# Patient Record
Sex: Male | Born: 2000 | Race: White | Hispanic: No | Marital: Single | Smoking: Never smoker
Health system: Southern US, Community
[De-identification: ages and names within clinical notes are randomized; demographics above are authoritative.]

## PROBLEM LIST (undated history)

## (undated) DIAGNOSIS — N200 Calculus of kidney: Secondary | ICD-10-CM

## (undated) DIAGNOSIS — E66813 Obesity, class 3: Secondary | ICD-10-CM

## (undated) DIAGNOSIS — J309 Allergic rhinitis, unspecified: Secondary | ICD-10-CM

## (undated) DIAGNOSIS — E669 Obesity, unspecified: Secondary | ICD-10-CM

## (undated) HISTORY — DX: Morbid (severe) obesity due to excess calories: E66.01

## (undated) HISTORY — DX: Obesity, class 3: E66.813

## (undated) HISTORY — DX: Allergic rhinitis, unspecified: J30.9

## (undated) HISTORY — DX: Obesity, unspecified: E66.9

## (undated) HISTORY — PX: TONSILLECTOMY: SUR1361

---
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
## 2020-02-21 ENCOUNTER — Ambulatory Visit (INDEPENDENT_AMBULATORY_CARE_PROVIDER_SITE_OTHER): Payer: Medicaid Other | Admitting: Licensed Clinical Social Worker

## 2020-02-21 ENCOUNTER — Other Ambulatory Visit: Payer: Self-pay

## 2020-02-21 DIAGNOSIS — Z0001 Encounter for general adult medical examination with abnormal findings: Secondary | ICD-10-CM

## 2020-02-21 DIAGNOSIS — F902 Attention-deficit hyperactivity disorder, combined type: Secondary | ICD-10-CM

## 2020-02-21 NOTE — BH Specialist Note (Signed)
Integrated Behavioral Health Initial Visit  MRN: [id number] Name: Nathan Bird  Number of Integrated Behavioral Health Clinician visits:: 1/6 Session Start time: 2:15pm Session End time: 3:10pm Total time: 55   Type of Service: Integrated Behavioral Health- Individual Interpretor:No.   SUBJECTIVE: Nathan Bird is a 19 y.o. male who attended his appointment alone today. Patient was referred by his own request due to problems at work. Patient reports the following symptoms/concerns: Patient reports that he has been feeling very overwhelmed at times with work because he cannot communicate with others clearly, manage his time effectively and feels anxious about being an annoyance or burden to others.  Duration of problem: several years; Severity of problem: mild  OBJECTIVE: Mood: Anxious and Affect: Appropriate Risk of harm to self or others: No plan to harm self or others  LIFE CONTEXT: Family and Social: Patient lives at home with Nathan Bird, Nathan Bird and Paternal Uncle.  Patient's Uncle has some health complications that require assistance with ADL's.  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 Self-Care: Patient is managing his own bills including car insurance, soon to be car payment and his phone bill.  Life Changes: Patient changed jobs from [employer] about three months ago.   GOALS ADDRESSED: Patient will: 1. Reduce symptoms of: anxiety and difficulty focusing 2. Increase knowledge and/or ability of: coping skills and healthy habits  3. Demonstrate ability to: Increase healthy adjustment to current life circumstances  INTERVENTIONS: Interventions utilized: Solution-Focused Strategies, Supportive Counseling, Psychoeducation and/or Health Education and Link to [facility]  Standardized Assessments completed: Not Needed  ASSESSMENT: Patient currently experiencing frustrations with work and day to day tasks.  Patient reports  that he feels very stressed about having to ask for help and/or interrupting others.  Patient reports that the often apologizes for asking for help even though he knows it is expected and required for some of his job functions.  Patient reports that he much prefers to work with his hands and tasks like paperwork feel very frustrating for him. Patient reports that he has always had a hard time with finishing tasks, organizational skills, time management, fidgeting and avoids sedentary tasks.  The Patient reports that he did not like school much and was an average student but often only passed classes because he tested well (did not finish class assignments often). Patient reports that he feels pressure at work because he often forgets things or does not finish them and then forgets to come back to it (requiring co-workers to ask him about it) and feels angry at himself about not doing what he meant to.  Patient reports that he sometimes gets irritable towards others when they ask him about these symptoms of careless mistakes but he mostly feels angry because he feels they don't see him as a professional or presents the way he wants to be seen by others.  The Patient reports that for as long as he can remember he has had trouble going to sleep but is very difficult to wake.  Patient reports that he goes to bed every night with no phone, TV or distractions at the same time but continue to go over his day and try to re-think how he handled things.  Patient reports that he often has to get back out of bed several times before he can go to sleep for various things.  Patient reports that once he goes to sleep he is a hard sleeper and does not wake  up for any alarms (his Nathan Bird has to wake him up every morning multiple times to get him out of bed).  Patient reports that he tried melatonin in the past but felt more groggy in the mornings when he took it. Clinician reviewed with patient diagnostic criteria for ADHD and treatment  options.  Patient is open to medication for ADHD, clinician reviewed risks noting no history of heart problems, substance use (some occasional use of Marijuana reported), or blood pressure concerns.     Patient may benefit from evaluation for possible ADHD symptoms.  Patient reports he has had these symptoms for as long as he can remember but has never sought treatment for them, he now feels the need to address them because he is concerned it's impacting his ability to work.  The Patient describes anxiety and anger related to frustration with symptoms of ADHD and feels they will improve in correlation with ADHD improvement.  PLAN: 1. Follow up with behavioral health clinician as needed 2. Behavioral recommendations: schedule visit with Psychiatrist to evaluate for adult ADHD.  3. Referral(s): Community Mental Health Services (LME/Outside Clinic)-Referral to [facility] with Dr. Tenny Craw.    Katheran Awe, [facility]

## 2020-03-13 ENCOUNTER — Telehealth: Payer: Self-pay | Admitting: Licensed Clinical Social Worker

## 2020-03-13 NOTE — Telephone Encounter (Signed)
Called to follow up on referral to Dr. Tenny Craw, was told the referral was missed and/or overlooked but he would be contacted in the next day pending approval from Dr. Tenny Craw.

## 2020-04-01 ENCOUNTER — Ambulatory Visit: Payer: Medicaid Other | Admitting: Pediatrics

## 2020-04-01 ENCOUNTER — Ambulatory Visit (INDEPENDENT_AMBULATORY_CARE_PROVIDER_SITE_OTHER): Payer: Medicaid Other | Admitting: Pediatrics

## 2020-04-01 ENCOUNTER — Other Ambulatory Visit: Payer: Self-pay

## 2020-04-01 ENCOUNTER — Encounter: Payer: Self-pay | Admitting: Pediatrics

## 2020-04-01 VITALS — Wt 290.2 lb

## 2020-04-01 DIAGNOSIS — B369 Superficial mycosis, unspecified: Secondary | ICD-10-CM | POA: Diagnosis not present

## 2020-04-01 MED ORDER — NYSTATIN 100000 UNIT/GM EX CREA: TOPICAL_CREAM | CUTANEOUS | 1 refills | Status: DC

## 2020-04-01 NOTE — Patient Instructions (Signed)
Jock Itch Jock itch (tinea cruris) is an infection of the skin in the groin area. It is caused by a fungus, which is a type of germ that lives in dark, damp places. Jock itch causes an itchy rash in the groin and upper thigh area. It usually goes away in 2-3 weeks with treatment. What are the causes? The fungus that causes jock itch may be spread by:  Touching a fungus infection elsewhere on your body, such as athlete's foot, and then touching your groin area.  Sharing towels or clothing, such as socks or shoes, with someone who has a fungal infection. What increases the risk? Jock itch is most common in men and adolescent boys. You are also more likely to develop the condition if you:  Are in a hot, humid climate.  Wear tight-fitting clothing or wet bathing suits for long periods of time.  Play sports.  Are overweight.  Have diabetes.  Have a weakened immune system.  Sweat a lot. What are the signs or symptoms? Symptoms of jock itch may include:  A red, pink, or brown rash in the groin area. Blisters may be present. The rash may spread to the thighs, anus, and buttocks.  Dry and scaly skin on or around the rash.  Itchiness. How is this diagnosed? In most cases, your health care provider can make the diagnosis by looking at your rash. In some cases, a sample of infected skin may be scraped off. This sample may be examined under a microscope (biopsy) or by trying to grow the fungus from the sample (culture). How is this treated? Treatment for this condition may include:  Antifungal medicine to kill the fungus. This may be a skin cream, ointment, or powder, or it may be a medicine that you take by mouth.  Skin cream or ointment to reduce itching.  Lifestyle changes, such as wearing looser clothing and caring for your skin. Follow these instructions at home: Skin care  Apply skin creams, ointments, or powders exactly as told by your health care provider.  Wear  loose-fitting clothing that does not rub against your groin area. Men should wear boxer shorts or loose-fitting underwear.  Keep your groin area clean and dry. ? Change your underwear every day. ? Change out of wet bathing suits as soon as possible. ? After bathing, use a separate towel to dry your groin area thoroughly and gently. Using a separate towel will help prevent spreading the infection to other areas of your body.  Avoid hot baths and showers. Hot water can make itching worse.  Do not scratch the affected area. General instructions  Take and apply over-the-counter and prescription medicines only as told by your health care provider.  Do not share towels, clothing, or personal items with other people.  Wash your hands often with soap and water, especially after touching your groin area. If soap and water are not available, use alcohol-based hand sanitizer. Contact a health care provider if:  Your rash: ? Gets worse or does not get better after 2 weeks of treatment. ? Spreads. ? Returns after treatment is finished.  You have any of the following: ? A fever. ? New or worsening redness, swelling, or pain around your rash. ? Fluid, blood, or pus coming from your rash. Summary  Jock itch (tinea cruris) is a fungal infection of the skin in the groin area.  The fungus can be spread by sharing clothing or by touching a fungus infection elsewhere on your body and   then touching your groin area.  Treatment may include antifungal medicine and lifestyle changes, such as keeping the area clean and dry. This information is not intended to replace advice given to you by your health care provider. Make sure you discuss any questions you have with your health care provider. Document Revised: 06/17/2017 Document Reviewed: 06/15/2017 Elsevier Patient Education  2020 Elsevier Inc.  

## 2020-04-01 NOTE — Progress Notes (Signed)
Subjective:     Nathan Bird is a 19 y.o. male who presents for evaluation of a rash involving the groin. Rash started a few days ago. Lesions are thick, and raised in texture. Rash has changed over time. Rash is painful. Associated symptoms: none. Patient denies: fever. Patient has not had contacts with similar rash. Patient has not had new exposures (soaps, lotions, laundry detergents, foods, medications, plants, insects or animals).  The following portions of the patient's history were reviewed and updated as appropriate: allergies, current medications, past medical history and problem list.  Review of Systems Pertinent items are noted in HPI.    Objective:    Wt 290 lb 3.2 oz (131.6 kg)   BMI 42.86 kg/m  General:  alert and cooperative  Skin:  erythematous papules and erythematous plaques on groin area      Assessment:    Superficial skin infection     Plan:  .1. Superficial fungal infection of skin - nystatin cream (MYCOSTATIN); Apply to rash three times a day for up to two weeks  Dispense: 30 g; Refill: 1   Written and verbal instructions

## 2020-04-03 ENCOUNTER — Ambulatory Visit
Admission: EM | Admit: 2020-04-03 | Discharge: 2020-04-03 | Disposition: A | Discharge status: Home / Self Care | Payer: Medicaid Other | Attending: Emergency Medicine | Admitting: Emergency Medicine

## 2020-04-03 ENCOUNTER — Telehealth: Payer: Self-pay | Admitting: Pediatrics

## 2020-04-03 ENCOUNTER — Encounter: Payer: Self-pay | Admitting: Emergency Medicine

## 2020-04-03 DIAGNOSIS — B369 Superficial mycosis, unspecified: Secondary | ICD-10-CM | POA: Diagnosis not present

## 2020-04-03 MED ORDER — KETOCONAZOLE 2 % EX CREA: 1.0000 "application " | TOPICAL_CREAM | Freq: Every day | CUTANEOUS | 0 refills | Status: DC

## 2020-04-03 MED ORDER — FLUCONAZOLE 200 MG PO TABS
200.0000 mg | ORAL_TABLET | Freq: Every day | ORAL | 0 refills | Status: DC
Start: 2020-04-03 — End: 2020-04-30

## 2020-04-03 NOTE — Discharge Instructions (Signed)
Prescribed fluconazole (Diflucan) by mouth Prescribed ketoconazole cream Take as prescribed and to completion Follow up with PCP if symptoms persists Return or go to the ER if you have any new or worsening symptoms

## 2020-04-03 NOTE — ED Provider Notes (Signed)
[facility]   [id number] 04/03/20 Arrival Time: 1136   Chief Complaint  Patient presents with   Rash     SUBJECTIVE: History from: patient and family.  Nathan Bird is a 19 y.o. male who presented to the urgent care for complaint of rash to bilateral groin for the past 2 days.  Was seen by PCP and was prescribed nystatin cream with no relief.  Denies sick exposure. Describes it red and itchy.  Has tried nystatin cream without relief.  Denies aggravating factors.  Denies similar symptoms in the past.  Denies chills, fever, nausea, vomiting, diarrhea  ROS: As per HPI.  All other pertinent ROS negative.     Past Medical History:  Diagnosis Date   Allergic rhinitis 12/08/2012   Obesity, Class III, BMI 40-49.9 (morbid obesity) ([facility])    Obesity, unspecified 12/08/2012   Past Surgical History:  Procedure Laterality Date   TONSILLECTOMY     No Known Allergies No current facility-administered medications on file prior to encounter.   Current Outpatient Medications on File Prior to Encounter  Medication Sig Dispense Refill   cetirizine (ZYRTEC) 10 MG tablet Take 1 tablet (10 mg total) by mouth daily. 30 tablet 5   fluticasone (FLONASE) 50 MCG/ACT nasal spray Place 2 sprays into both nostrils daily. 16 g 6   nystatin cream (MYCOSTATIN) Apply to rash three times a day for up to two weeks 30 g 1   Olopatadine HCl 0.2 % SOLN Apply 1 drop to eye daily. 1 Bottle 0   Social History   Socioeconomic History   Marital status: Single    Spouse name: Not on file   Number of children: Not on file   Years of education: Not on file   Highest education level: Not on file  Occupational History   Not on file  Tobacco Use   Smoking status: Passive Smoke Exposure - Never Smoker   Smokeless tobacco: Never Used  Vaping Use   Vaping Use: Every day  Substance and Sexual Activity   Alcohol use: Yes    Comment: occ   Drug use: No   Sexual activity: Not on  file  Other Topics Concern   Not on file  Social History Narrative   Lives with both parents   Social Determinants of Health   Financial Resource Strain:    Difficulty of Paying Living Expenses: Not on file  Food Insecurity:    Worried About Running Out of Food in the Last Year: Not on file   Ran Out of Food in the Last Year: Not on file  Transportation Needs:    Lack of Transportation (Medical): Not on file   Lack of Transportation (Non-Medical): Not on file  Physical Activity:    Days of Exercise per Week: Not on file   Minutes of Exercise per Session: Not on file  Stress:    Feeling of Stress : Not on file  Social Connections:    Frequency of Communication with Friends and Family: Not on file   Frequency of Social Gatherings with Friends and Family: Not on file   Attends Religious Services: Not on file   Active Member of Clubs or Organizations: Not on file   Attends Banker Meetings: Not on file   Marital Status: Not on file  Intimate Partner Violence:    Fear of Current or Ex-Partner: Not on file   Emotionally Abused: Not on file   Physically Abused: Not on file   Sexually  Abused: Not on file   Family History  Problem Relation Age of Onset   Diabetes Paternal Grandfather    Stroke Paternal Grandfather    Cancer Mother    Depression Mother    Depression Father    Hypertension Father    Diabetes Father        borderline   Diabetes Maternal Grandmother    Cancer Maternal Grandfather        renal   Kidney disease Maternal Grandfather     OBJECTIVE:  Vitals:   04/03/20 1147  BP: 108/70  Pulse: (!) 57  Resp: 16  Temp: 98 F (36.7 C)  SpO2: 99%     Physical Exam Vitals and nursing note reviewed.  Constitutional:      General: He is not in acute distress.    Appearance: Normal appearance. He is normal weight. He is not ill-appearing, toxic-appearing or diaphoretic.  Cardiovascular:     Rate and Rhythm: Normal  rate and regular rhythm.     Pulses: Normal pulses.     Heart sounds: Normal heart sounds. No murmur heard.  No friction rub. No gallop.   Pulmonary:     Effort: Pulmonary effort is normal. No respiratory distress.     Breath sounds: Normal breath sounds. No stridor. No wheezing, rhonchi or rales.  Chest:     Chest wall: No tenderness.  Skin:    Findings: Rash present. Rash is macular.     Comments: Macular rash to rash to bilateral groin  Neurological:     Mental Status: He is alert and oriented to person, place, and time.    Psychological: alert and cooperative; normal mood and affect  LABS:  No results found for this or any previous visit (from the past 24 hour(s)).   ASSESSMENT & PLAN:  1. Superficial fungus infection of skin     Meds ordered this encounter  Medications   fluconazole (DIFLUCAN) 200 MG tablet    Sig: Take 1 tablet (200 mg total) by mouth daily. Take the second dose 72 hours after the first if symptom does not resolve    Dispense:  2 tablet    Refill:  0   ketoconazole (NIZORAL) 2 % cream    Sig: Apply 1 application topically daily.    Dispense:  15 g    Refill:  0    Discharge Instructions  Prescribed fluconazole (Diflucan) by mouth Prescribed ketoconazole cream Take as prescribed and to completion Follow up with PCP if symptoms persists Return or go to the ER if you have any new or worsening symptoms  Reviewed expectations re: course of current medical issues. Questions answered. Outlined signs and symptoms indicating need for more acute intervention. Patient verbalized understanding. After Visit Summary given.         Durward Parcel, FNP 04/03/20 1213

## 2020-04-03 NOTE — Telephone Encounter (Cosign Needed)
Left voicemail with mother about treatment for son's fungal infection of his skin. Advised mother to continue medication regimen as prescribed by Dr. Meredeth Ide and to ensure area is cleansed and dried well prior to medication application.

## 2020-04-03 NOTE — ED Triage Notes (Signed)
Patient states that he has rash in his groin area that is dry and cracking, [facility] and they rx a cream fro him to use says it makes it worse

## 2020-04-24 NOTE — Progress Notes (Signed)
Virtual Visit via Video Note  I connected with Nathan Bird on 04/30/20 at 11:00 AM EDT by a video enabled telemedicine application and verified that I am speaking with the correct person using two identifiers.   I discussed the limitations of evaluation and management by telemedicine and the availability of in person appointments. The patient expressed understanding and agreed to proceed.   I discussed the assessment and treatment plan with the patient. The patient was provided an opportunity to ask questions and all were answered. The patient agreed with the plan and demonstrated an understanding of the instructions.   The patient was advised to call back or seek an in-person evaluation if the symptoms worsen or if the condition fails to improve as anticipated.  Location: patient- car, provider- home office   I provided 45 minutes of non-face-to-face time during this encounter.   Neysa Hotter, MD     Psychiatric Initial Adult Assessment   Patient Identification: Nathan Bird MRN:  [id number] Date of Evaluation:  04/24/2020 Referral Source: Rosiland Oz, MD Chief Complaint: "Its been bothering me a lot" Visit Diagnosis: No diagnosis found.  History of Present Illness:   Nathan Bird is a 19 y.o. year old male with a history of no mental illness, who is referred for anxiety.   He states that he made this appointment as his parents told him that he is having bad panic attacks.  He states that he has been feeling stressed at work, although he cannot pinpoint the exact cause.  He constantly fights with guilt, stating that he is not doing anything right.  He always needs a second opinion, and reassurance.  He talks about an episode of he became super emotional, and cannot stop crying for an hour.  He did not know what was the trigger.  He also feels frustrated that nobody understands him, and he himself even does not understand himself.  He always wakes up,  feeling that he will have terrible day.  He agrees that he tends to think about worse case scenario.  He talks about the recent race truck event. Although he was looking forward to it, he felt sick after 2 hours, and wanted to be out.  He felt confused, and very stressed.   He has insomnia.  He feels depressed, although he states that he would feel better if he does not have any anxiety.  He has difficulty in concentration.  He has decreased appetite; he lost 30 pounds/3-6 months.  He denies SI.  He feels anxious and tense.  He becomes angry at times, although he denies any aggression.  He feels fatigued.   Bipolar- He reports a period of feeling full of energy, have mild euphoria, which lasts for a day or less. He had impulsivity for shopping in th past  Substance- Alcohol; 1-2 flavored drinks occasionally, uses marijuana for "mental state"   Associated Signs/Symptoms: Depression Symptoms:  depressed mood, insomnia, fatigue, difficulty concentrating, decreased appetite, (Hypo) Manic Symptoms:  reports burst of energy, impulsive decision Anxiety Symptoms:  Excessive Worry, Psychotic Symptoms:  denies AH, VH, paranoia PTSD Symptoms: Negative  Past Psychiatric History:  Outpatient: denies Psychiatry admission: denies  Previous suicide attempt: denies Past trials of medication: denies History of violence: denies   Previous Psychotropic Medications: No   Substance Abuse History in the last 12 months:  Yes.    Consequences of Substance Abuse: Negative  Past Medical History:  Past Medical History:  Diagnosis Date  . Allergic  rhinitis 12/08/2012  . Obesity, Class III, BMI 40-49.9 (morbid obesity) (HCC)   . Obesity, unspecified 12/08/2012    Past Surgical History:  Procedure Laterality Date  . TONSILLECTOMY      Family Psychiatric History:  As below  Family History:  Family History  Problem Relation Age of Onset  . Diabetes Paternal Grandfather   . Stroke Paternal  Grandfather   . Cancer Mother   . Depression Mother   . Depression Father   . Hypertension Father   . Diabetes Father        borderline  . Diabetes Maternal Grandmother   . Cancer Maternal Grandfather        renal  . Kidney disease Maternal Grandfather     Social History:   Social History   Socioeconomic History  . Marital status: Single    Spouse name: Not on file  . Number of children: Not on file  . Years of education: Not on file  . Highest education level: Not on file  Occupational History  . Not on file  Tobacco Use  . Smoking status: Passive Smoke Exposure - Never Smoker  . Smokeless tobacco: Never Used  Vaping Use  . Vaping Use: Every day  Substance and Sexual Activity  . Alcohol use: Yes    Comment: occ  . Drug use: No  . Sexual activity: Not on file  Other Topics Concern  . Not on file  Social History Narrative   Lives with both parents   Social Determinants of Health   Financial Resource Strain:   . Difficulty of Paying Living Expenses: Not on file  Food Insecurity:   . Worried About Programme researcher, broadcasting/film/video in the Last Year: Not on file  . Ran Out of Food in the Last Year: Not on file  Transportation Needs:   . Lack of Transportation (Medical): Not on file  . Lack of Transportation (Non-Medical): Not on file  Physical Activity:   . Days of Exercise per Week: Not on file  . Minutes of Exercise per Session: Not on file  Stress:   . Feeling of Stress : Not on file  Social Connections:   . Frequency of Communication with Friends and Family: Not on file  . Frequency of Social Gatherings with Friends and Family: Not on file  . Attends Religious Services: Not on file  . Active Member of Clubs or Organizations: Not on file  . Attends Banker Meetings: Not on file  . 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 Used to do  similar work before Support: parents Household: parents Marital status: single Number of children: 0  Education: dropped out at 4th year in high school as he was unable to graduate on time. Wanted to focus on work He describes as "nothing special" childhood. He has half brother and sister.   Allergies:  No Known Allergies  Metabolic Disorder Labs: Lab Results  Component Value Date   HGBA1C 5.3 07/06/2016   MPG 100 02/02/2013   No results found for: PROLACTIN Lab Results  Component Value Date   CHOL 106 07/06/2016   TRIG 61 07/06/2016   HDL 37 (L) 07/06/2016   CHOLHDL 2.9 07/06/2016   LDLCALC 57 07/06/2016   Lab Results  Component Value Date   TSH 0.850 07/06/2016    Therapeutic Level Labs: No results found for:  LITHIUM No results found for: CBMZ No results found for: VALPROATE  Current Medications: Current Outpatient Medications  Medication Sig Dispense Refill  . cetirizine (ZYRTEC) 10 MG tablet Take 1 tablet (10 mg total) by mouth daily. 30 tablet 5  . fluconazole (DIFLUCAN) 200 MG tablet Take 1 tablet (200 mg total) by mouth daily. Take the second dose 72 hours after the first if symptom does not resolve 2 tablet 0  . fluticasone (FLONASE) 50 MCG/ACT nasal spray Place 2 sprays into both nostrils daily. 16 g 6  . ketoconazole (NIZORAL) 2 % cream Apply 1 application topically daily. 15 g 0  . nystatin cream (MYCOSTATIN) Apply to rash three times a day for up to two weeks 30 g 1  . Olopatadine HCl 0.2 % SOLN Apply 1 drop to eye daily. 1 Bottle 0   No current facility-administered medications for this visit.    Musculoskeletal: Strength & Muscle Tone: N/A Gait & Station: N/A Patient leans: N/A  Psychiatric Specialty Exam: Review of Systems  Psychiatric/Behavioral: Positive for decreased concentration, dysphoric mood and sleep disturbance. Negative for agitation, behavioral problems, confusion, hallucinations, self-injury and suicidal ideas. The patient is  nervous/anxious. The patient is not hyperactive.   All other systems reviewed and are negative.   There were no vitals taken for this visit.There is no height or weight on file to calculate BMI.  General Appearance: Fairly Groomed  Eye Contact:  Good  Speech:  Clear and Coherent  Volume:  Normal  Mood:  Anxious  Affect:  Appropriate, Congruent and Restricted, tearful at times  Thought Process:  Coherent  Orientation:  Full (Time, Place, and Person)  Thought Content:  Logical  Suicidal Thoughts:  No  Homicidal Thoughts:  No  Memory:  Immediate;   Good  Judgement:  Good  Insight:  Fair  Psychomotor Activity:  Normal  Concentration:  Concentration: Good and Attention Span: Good  Recall:  Good  Fund of Knowledge:Good  Language: Good  Akathisia:  No  Handed:  Right  AIMS (if indicated):  not done  Assets:  Communication Skills Desire for Improvement  ADL's:  Intact  Cognition: WNL  Sleep:  Poor   Screenings: PHQ2-9     Integrated Behavioral Health from 05/08/2019 in [facility]  PHQ-2 Total Score 3  PHQ-9 Total Score 8      Assessment and Plan:  Nathan Bird is a 19 y.o. year old male with a history of no mental illness, who is referred for anxiety.   He reports symptoms consistent with GAD for several months.  Psychosocial stressors includes work, although he cannot pinpoint the exact cause.  Will start sertraline with plan to do slow up titration to target anxiety.  Discussed potential GI side effect, and SI in younger population.  Will obtain TSH to rule out medical cause.  Although he will greatly benefit from CBT, he is unable to do this due to work schedule.   Plan 1. Start sertraline 25 mg daily for two weeks, then 50 mg daily  2. Check TSH 3. Next appointment- 11/17 at 11 AM for 30 mins, video   The patient demonstrates the following risk factors for suicide: Chronic risk factors for suicide include: psychiatric disorder of anxiety. Acute risk  factors for suicide include: N/A. Protective factors for this patient include: positive social support and hope for the future. Considering these factors, the overall suicide risk at this point appears to be low. Patient is appropriate for outpatient follow up.  Neysa Hottereina Jovita Persing, MD 10/7/20212:12 PM

## 2020-04-30 ENCOUNTER — Other Ambulatory Visit: Payer: Self-pay

## 2020-04-30 ENCOUNTER — Telehealth (INDEPENDENT_AMBULATORY_CARE_PROVIDER_SITE_OTHER): Payer: Medicaid Other | Admitting: Psychiatry

## 2020-04-30 ENCOUNTER — Encounter (HOSPITAL_COMMUNITY): Payer: Self-pay | Admitting: Psychiatry

## 2020-04-30 DIAGNOSIS — F411 Generalized anxiety disorder: Secondary | ICD-10-CM | POA: Diagnosis not present

## 2020-04-30 MED ORDER — SERTRALINE HCL 50 MG PO TABS: ORAL_TABLET | ORAL | 1 refills | Status: DC

## 2020-05-02 DIAGNOSIS — F411 Generalized anxiety disorder: Secondary | ICD-10-CM | POA: Diagnosis not present

## 2020-05-03 LAB — TSH: TSH: 0.71 mIU/L (ref 0.50–4.30)

## 2020-05-05 ENCOUNTER — Encounter (HOSPITAL_COMMUNITY): Payer: Self-pay | Admitting: Psychiatry

## 2020-05-08 ENCOUNTER — Ambulatory Visit: Payer: Self-pay

## 2020-05-26 NOTE — Progress Notes (Signed)
Virtual Visit via Video Note  I connected with Nathan Bird on 06/04/20 at 11:00 AM EST by a video enabled telemedicine application and verified that I am speaking with the correct person using two identifiers.  Location: Patient: porch Provider: office   I discussed the limitations of evaluation and management by telemedicine and the availability of in person appointments. The patient expressed understanding and agreed to proceed.   I discussed the assessment and treatment plan with the patient. The patient was provided an opportunity to ask questions and all were answered. The patient agreed with the plan and demonstrated an understanding of the instructions.   The patient was advised to call back or seek an in-person evaluation if the symptoms worsen or if the condition fails to improve as anticipated.  I provided 18 minutes of non-face-to-face time during this encounter.   Neysa Hotter, MD    [facility] MD/PA/NP OP Progress Note  06/04/2020 11:23 AM KEONDRICK DILKS  MRN:  [id number]  Chief Complaint:  Chief Complaint    Follow-up; Anxiety     HPI:  This is a follow-up appointment for anxiety.  He states that things has been much better and smooth.  Although he may occasionally have "bad days," he is unable to elaborate it, stating that nothing is severe, and his mood is better.  He denies any issues at work.  He notices that he has more motivation to do things rather than dragging himself.  He denies any concern except that he has more loose stool since starting medication.  He sleeps well.  He reports improvement in concentration.  He has good appetite.  He gained 12 pounds since the last visit; he denies any concerns about this, stating that he had lost more than 25 pounds over 6 months.  He has more energy.  He denies SI.  He feels tense and irritable at times.  He denies panic attacks.   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 Wanted to focus on work He describes as "nothing special" childhood. He has half brother and sister.    Visit Diagnosis:    ICD-10-CM   1. GAD (generalized anxiety disorder)  F41.1     Past Psychiatric History: Please see initial evaluation for full details. I have reviewed the history. No updates at this time.     Past Medical History:  Past Medical History:  Diagnosis Date  . Allergic rhinitis 12/08/2012  . Obesity, Class III, BMI 40-49.9 (morbid obesity) (HCC)   . Obesity, unspecified 12/08/2012    Past Surgical History:  Procedure Laterality Date  . TONSILLECTOMY      Family Psychiatric History: Please see initial evaluation for full details. I have reviewed the history. No updates at this time.     Family History:  Family History  Problem Relation Age of Onset  . Diabetes Paternal Grandfather   . Stroke Paternal Grandfather   . Cancer Mother   . Depression Mother   . Depression Father   . Hypertension Father   . Diabetes Father        borderline  . Diabetes Maternal Grandmother   . Cancer Maternal Grandfather        renal  . Kidney disease Maternal  Grandfather     Social History:  Social History   Socioeconomic History  . Marital status: Single    Spouse name: Not on file  . Number of children: Not on file  . Years of education: Not on file  . Highest education level: Not on file  Occupational History  . Not on file  Tobacco Use  . Smoking status: Passive Smoke Exposure - Never Smoker  . Smokeless tobacco: Never Used  Vaping Use  . Vaping Use: Every day  Substance and Sexual Activity  . Alcohol use: Yes    Comment: occ  . Drug use: No  . Sexual activity: Not on file  Other Topics Concern  . Not on file  Social  History Narrative   Lives with both parents   Social Determinants of Health   Financial Resource Strain:   . Difficulty of Paying Living Expenses: Not on file  Food Insecurity:   . Worried About Programme researcher, broadcasting/film/video in the Last Year: Not on file  . Ran Out of Food in the Last Year: Not on file  Transportation Needs:   . Lack of Transportation (Medical): Not on file  . Lack of Transportation (Non-Medical): Not on file  Physical Activity:   . Days of Exercise per Week: Not on file  . Minutes of Exercise per Session: Not on file  Stress:   . Feeling of Stress : Not on file  Social Connections:   . Frequency of Communication with Friends and Family: Not on file  . Frequency of Social Gatherings with Friends and Family: Not on file  . Attends Religious Services: Not on file  . Active Member of Clubs or Organizations: Not on file  . Attends Banker Meetings: Not on file  . Marital Status: Not on file    Allergies: No Known Allergies  Metabolic Disorder Labs: Lab Results  Component Value Date   HGBA1C 5.3 07/06/2016   MPG 100 02/02/2013   No results found for: PROLACTIN Lab Results  Component Value Date   CHOL 106 07/06/2016   TRIG 61 07/06/2016   HDL 37 (L) 07/06/2016   CHOLHDL 2.9 07/06/2016   LDLCALC 57 07/06/2016   Lab Results  Component Value Date   TSH 0.71 05/02/2020   TSH 0.850 07/06/2016    Therapeutic Level Labs: No results found for: LITHIUM No results found for: VALPROATE No components found for:  CBMZ  Current Medications: Current Outpatient Medications  Medication Sig Dispense Refill  . escitalopram (LEXAPRO) 10 MG tablet 5 mg daily for one week, then 10 mg daily 30 tablet 1  . sertraline (ZOLOFT) 50 MG tablet 25 mg daily for two weeks, then 50 mg daily 30 tablet 1   No current facility-administered medications for this visit.     Musculoskeletal: Strength & Muscle Tone: N/A Gait & Station: N/A Patient leans: N/A  Psychiatric  Specialty Exam: Review of Systems  Psychiatric/Behavioral: Negative for agitation, behavioral problems, confusion, decreased concentration, dysphoric mood, hallucinations, self-injury, sleep disturbance and suicidal ideas. The patient is nervous/anxious. The patient is not hyperactive.   All other systems reviewed and are negative.   There were no vitals taken for this visit.There is no height or weight on file to calculate BMI.  General Appearance: Fairly Groomed  Eye Contact:  Good  Speech:  Clear and Coherent  Volume:  Normal  Mood:  much better  Affect:  Appropriate, Congruent and euthymic  Thought Process:  Coherent  Orientation:  Full (  Time, Place, and Person)  Thought Content: Logical   Suicidal Thoughts:  No  Homicidal Thoughts:  No  Memory:  Immediate;   Good  Judgement:  Good  Insight:  Fair  Psychomotor Activity:  Normal  Concentration:  Concentration: Good and Attention Span: Good  Recall:  Good  Fund of Knowledge: Good  Language: Good  Akathisia:  No  Handed:  Right  AIMS (if indicated): not done  Assets:  Communication Skills Desire for Improvement  ADL's:  Intact  Cognition: WNL  Sleep:  Fair   Screenings: PHQ2-9     Integrated Behavioral Health from 05/08/2019 in [facility]  PHQ-2 Total Score 3  PHQ-9 Total Score 8       Assessment and Plan:  HARLIN MAZZONI is a 19 y.o. year old male with a history of anxiety, who presents for follow up appointment for below.   1. GAD (generalized anxiety disorder) There has been significant improvement in and anxiety since starting sertraline.  However, we will switch this to Lexapro given he experiences side effect of loose stools and weight gain.  Discussed potential GI side effect, and SI in younger population.   Plan 1. Decrease sertraline 25 mg daily for one week, then discontinue  2. Start lexapro 5 mg daily for one week, then 10 mg daily  -monitor weight gain, GI side effect 3. Next  appointment-  1/6 at 1 PM for 20 mins, video  Medication trials- sertraline/loose stools  The patient demonstrates the following risk factors for suicide: Chronic risk factors for suicide include: psychiatric disorder of anxiety. Acute risk factors for suicide include: N/A. Protective factors for this patient include: positive social support and hope for the future. Considering these factors, the overall suicide risk at this point appears to be low. Patient is appropriate for outpatient follow up.  Neysa Hotter, MD 06/04/2020, 11:23 AM

## 2020-06-04 ENCOUNTER — Other Ambulatory Visit: Payer: Self-pay

## 2020-06-04 ENCOUNTER — Encounter: Payer: Self-pay | Admitting: Psychiatry

## 2020-06-04 ENCOUNTER — Telehealth (INDEPENDENT_AMBULATORY_CARE_PROVIDER_SITE_OTHER): Payer: Medicaid Other | Admitting: Psychiatry

## 2020-06-04 ENCOUNTER — Telehealth (HOSPITAL_COMMUNITY): Payer: Medicaid Other | Admitting: Psychiatry

## 2020-06-04 DIAGNOSIS — F411 Generalized anxiety disorder: Secondary | ICD-10-CM | POA: Diagnosis not present

## 2020-06-04 MED ORDER — ESCITALOPRAM OXALATE 10 MG PO TABS: ORAL_TABLET | ORAL | 1 refills | Status: DC

## 2020-06-23 ENCOUNTER — Telehealth: Payer: Self-pay

## 2020-06-23 NOTE — Telephone Encounter (Signed)
pt mother called states that the medications is not work.. states wants to speak with you about this.  she states that the lexapro doesnt seem to help any and then the zoloft gives him diarrhea.

## 2020-06-23 NOTE — Telephone Encounter (Signed)
I cannot talk with her as we do not have consent form. Please advise the patient to have follow up to discuss this if he is also concerned about this.

## 2020-06-25 NOTE — Telephone Encounter (Signed)
meeds to speak with doctor today about what to do with patient medication issues.

## 2020-06-25 NOTE — Telephone Encounter (Signed)
As I sent you in a previous mail, I will not be able to talk with his mother without patient written consent. Please contact the patient to make follow up appointment to discuss this.

## 2020-06-26 ENCOUNTER — Encounter: Payer: Self-pay | Admitting: Psychiatry

## 2020-06-26 ENCOUNTER — Telehealth (INDEPENDENT_AMBULATORY_CARE_PROVIDER_SITE_OTHER): Payer: Medicaid Other | Admitting: Psychiatry

## 2020-06-26 ENCOUNTER — Other Ambulatory Visit: Payer: Self-pay

## 2020-06-26 DIAGNOSIS — F411 Generalized anxiety disorder: Secondary | ICD-10-CM

## 2020-06-26 NOTE — Telephone Encounter (Signed)
pt mother called again she was told info and she is not happy and she wants you to contact him then at [phone].

## 2020-06-26 NOTE — Telephone Encounter (Signed)
Discussed with the patient. He agrees to have appointment later today.

## 2020-06-26 NOTE — Progress Notes (Signed)
Virtual Visit via Telephone Note  I connected with Kenai Fluegel Petras on 06/26/20 at  1:40 PM EST by telephone and verified that I am speaking with the correct person using two identifiers.  Location: Patient: home Provider: office Persons participated in the visit- patient, provider   I discussed the limitations, risks, security and privacy concerns of performing an evaluation and management service by telephone and the availability of in person appointments. I also discussed with the patient that there may be a patient responsible charge related to this service. The patient expressed understanding and agreed to proceed.   I discussed the assessment and treatment plan with the patient. The patient was provided an opportunity to ask questions and all were answered. The patient agreed with the plan and demonstrated an understanding of the instructions.   The patient was advised to call back or seek an in-person evaluation if the symptoms worsen or if the condition fails to improve as anticipated.  I provided 13 minutes of non-face-to-face time during this encounter.   Neysa Hotter, MD    [facility] MD/PA/NP OP Progress Note  06/26/2020 1:49 PM SABASTIAN RAIMONDI  MRN:  [id number]  Chief Complaint:  Chief Complaint    Follow-up; Anxiety     HPI:  This is a follow-up appointment for anxiety.  He states that he wanted to get in touch with this provider as he had questions about the medication.  He was unsure of the direction he received about his medication at the last visit.  It has been reviewed.  He has not noticed much difference since starting Lexapro.  However, he thinks that he is doing significantly better compared to the initial visit.  Although he occasionally feels anxious ("outburst") at times, things are more manageable.  He had some anxiety when he was asked to do some task. He tends to took thing personally on those type of occasion.  However, he takes some time to recuperate,  and he is able to manage things well afterwards.  He has fair sleep.  He denies change in weight or appetite.  He has better concentration.  He had fleeting thought of "what's the point" when he had "some inconvenience," although he denies any plans/intent.  He denies irritability. He denies any side effect from Lexapro.  He agrees to continue the same dose at this time.   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 Wanted to focus on work He describes as "nothing special" childhood. He has half brother and sister.   Visit Diagnosis:    ICD-10-CM   1. GAD (generalized anxiety disorder)  F41.1     Past Psychiatric History: Please see initial evaluation for full details. I have reviewed the history. No updates at this time.     Past Medical History:  Past Medical History:  Diagnosis Date  . Allergic rhinitis 12/08/2012  . Obesity, Class III, BMI 40-49.9 (morbid obesity) (HCC)   . Obesity, unspecified 12/08/2012    Past Surgical History:  Procedure Laterality Date  . TONSILLECTOMY      Family Psychiatric History: Please see initial evaluation for full details. I have reviewed the history. No updates at this time.     Family History:  Family History  Problem Relation Age of Onset  .  Diabetes Paternal Grandfather   . Stroke Paternal Grandfather   . Cancer Mother   . Depression Mother   . Depression Father   . Hypertension Father   . Diabetes Father        borderline  . Diabetes Maternal Grandmother   . Cancer Maternal Grandfather        renal  . Kidney disease Maternal Grandfather     Social History:  Social History   Socioeconomic History  . Marital status: Single    Spouse name: Not on file  . Number  of children: Not on file  . Years of education: Not on file  . Highest education level: Not on file  Occupational History  . Not on file  Tobacco Use  . Smoking status: Passive Smoke Exposure - Never Smoker  . Smokeless tobacco: Never Used  Vaping Use  . Vaping Use: Every day  Substance and Sexual Activity  . Alcohol use: Yes    Comment: occ  . Drug use: No  . Sexual activity: Not on file  Other Topics Concern  . Not on file  Social History Narrative   Lives with both parents   Social Determinants of Health   Financial Resource Strain: Not on file  Food Insecurity: Not on file  Transportation Needs: Not on file  Physical Activity: Not on file  Stress: Not on file  Social Connections: Not on file    Allergies: No Known Allergies  Metabolic Disorder Labs: Lab Results  Component Value Date   HGBA1C 5.3 07/06/2016   MPG 100 02/02/2013   No results found for: PROLACTIN Lab Results  Component Value Date   CHOL 106 07/06/2016   TRIG 61 07/06/2016   HDL 37 (L) 07/06/2016   CHOLHDL 2.9 07/06/2016   LDLCALC 57 07/06/2016   Lab Results  Component Value Date   TSH 0.71 05/02/2020   TSH 0.850 07/06/2016    Therapeutic Level Labs: No results found for: LITHIUM No results found for: VALPROATE No components found for:  CBMZ  Current Medications: Current Outpatient Medications  Medication Sig Dispense Refill  . escitalopram (LEXAPRO) 10 MG tablet 5 mg daily for one week, then 10 mg daily 30 tablet 1  . sertraline (ZOLOFT) 50 MG tablet 25 mg daily for two weeks, then 50 mg daily 30 tablet 1   No current facility-administered medications for this visit.     Musculoskeletal: Strength & Muscle Tone: N/A Gait & Station: N/A Patient leans: N/A  Psychiatric Specialty Exam: Review of Systems  Psychiatric/Behavioral: Negative for agitation, behavioral problems, confusion, decreased concentration, dysphoric mood, hallucinations, self-injury, sleep disturbance and  suicidal ideas. The patient is nervous/anxious. The patient is not hyperactive.   All other systems reviewed and are negative.   There were no vitals taken for this visit.There is no height or weight on file to calculate BMI.  General Appearance: NA  Eye Contact:  NA  Speech:  Clear and Coherent  Volume:  Normal  Mood:  Anxious  Affect:  NA  Thought Process:  Coherent  Orientation:  Full (Time, Place, and Person)  Thought Content: Logical   Suicidal Thoughts:  No  Homicidal Thoughts:  No  Memory:  Immediate;   Good  Judgement:  Good  Insight:  Good  Psychomotor Activity:  Normal  Concentration:  Concentration: Good and Attention Span: Good  Recall:  Good  Fund of Knowledge: Good  Language: Good  Akathisia:  No  Handed:  Right  AIMS (if indicated): not  done  Assets:  Communication Skills Desire for Improvement  ADL's:  Intact  Cognition: WNL  Sleep:  Fair   Screenings: [facility] from 05/08/2019 in [facility]  PHQ-2 Total Score 3  PHQ-9 Total Score 8       Assessment and Plan:  RAMON ZANDERS is a 19 y.o. year old male with a history of anxiety, who presents for follow up appointment for below.   1. GAD (generalized anxiety disorder) There has been more improvement in anxiety, although he denies significant difference since switching from sertraline to Lexapro.  He agrees to stay on the same dose at this time to see if it exerts more benefit.  He is aware of its potential GI side effect and worsening in SI in younger population.   Plan 1. Continue lexapro 10 mg daily   2. Next appointment-  1/6 at 1 PM for 20 mins, video  Medication trials- sertraline/loose stools  The patient demonstrates the following risk factors for suicide: Chronic risk factors for suicide include:psychiatric disorder ofanxiety. Acute risk factorsfor suicide include: N/A. Protective factorsfor this patient include: positive social  support and hope for the future. Considering these factors, the overall suicide risk at this point appears to below. Patientisappropriate for outpatient follow up.  Neysa Hotter, MD 06/26/2020, 1:49 PM

## 2020-07-10 NOTE — Progress Notes (Signed)
Virtual Visit via Telephone Note  I connected with Nathan Bird on 07/24/20 at  1:00 PM EST by telephone and verified that I am speaking with the correct person using two identifiers.  Location: Patient: work Provider: office Persons participated in the visit- patient, provider   I discussed the limitations, risks, security and privacy concerns of performing an evaluation and management service by telephone and the availability of in person appointments. I also discussed with the patient that there may be a patient responsible charge related to this service. The patient expressed understanding and agreed to proceed.   I discussed the assessment and treatment plan with the patient. The patient was provided an opportunity to ask questions and all were answered. The patient agreed with the plan and demonstrated an understanding of the instructions.   The patient was advised to call back or seek an in-person evaluation if the symptoms worsen or if the condition fails to improve as anticipated.  I provided 12 minutes of non-face-to-face time during this encounter.   Neysa Hotter, MD    [facility] MD/PA/NP OP Progress Note  07/24/2020 1:24 PM DEAUNTE DENTE  MRN:  [id number]  Chief Complaint:  Chief Complaint    Follow-up; Anxiety     HPI:  This is a follow-up appointment for anxiety.  He states that he feels the same way at the last visit, and it is not better or worse.  Although he states that things has been manageable, he continues to overstress himself as he needs verification for himself.  It usually happens at work; he will spend more time on car to make sure things are okay. He is worried that he might have missed something, and something can happen with that car.  He states that the work is now under new management, and his manager is patient was him.  He has fair sleep with occasional initial insomnia.  He denies feeling depressed.  He has fair concentration.  He has  slightly decreased appetite.  He denies SI.  He feels anxious and tense at times.  He had a panic attack when he was at home; he was discussing something with his parents which he does not recollect.  However, he remembers that he was very upset when they went more deep on the topic.   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 Wanted to focus on work He describes as "nothing special" childhood. He has half brother and sister.  Visit Diagnosis:    ICD-10-CM   1. GAD (generalized anxiety disorder)  F41.1     Past Psychiatric History: Please see initial evaluation for full details. I have reviewed the history. No updates at this time.     Past Medical History:  Past Medical History:  Diagnosis Date  . Allergic rhinitis 12/08/2012  . Obesity, Class III, BMI 40-49.9 (morbid obesity) (HCC)   . Obesity, unspecified 12/08/2012    Past Surgical History:  Procedure Laterality Date  . TONSILLECTOMY      Family Psychiatric History: Please see initial evaluation for full details. I have reviewed the history. No updates at this time.     Family History:  Family History  Problem Relation Age of Onset  . Diabetes Paternal Grandfather   . Stroke Paternal Grandfather   .  Cancer Mother   . Depression Mother   . Depression Father   . Hypertension Father   . Diabetes Father        borderline  . Diabetes Maternal Grandmother   . Cancer Maternal Grandfather        renal  . Kidney disease Maternal Grandfather     Social History:  Social History   Socioeconomic History  . Marital status: Single    Spouse name: Not on file  . Number of children: Not on file  . Years of education: Not on file  . Highest education level:  Not on file  Occupational History  . Not on file  Tobacco Use  . Smoking status: Passive Smoke Exposure - Never Smoker  . Smokeless tobacco: Never Used  Vaping Use  . Vaping Use: Every day  Substance and Sexual Activity  . Alcohol use: Yes    Comment: occ  . Drug use: No  . Sexual activity: Not on file  Other Topics Concern  . Not on file  Social History Narrative   Lives with both parents   Social Determinants of Health   Financial Resource Strain: Not on file  Food Insecurity: Not on file  Transportation Needs: Not on file  Physical Activity: Not on file  Stress: Not on file  Social Connections: Not on file    Allergies: No Known Allergies  Metabolic Disorder Labs: Lab Results  Component Value Date   HGBA1C 5.3 07/06/2016   MPG 100 02/02/2013   No results found for: PROLACTIN Lab Results  Component Value Date   CHOL 106 07/06/2016   TRIG 61 07/06/2016   HDL 37 (L) 07/06/2016   CHOLHDL 2.9 07/06/2016   LDLCALC 57 07/06/2016   Lab Results  Component Value Date   TSH 0.71 05/02/2020   TSH 0.850 07/06/2016    Therapeutic Level Labs: No results found for: LITHIUM No results found for: VALPROATE No components found for:  CBMZ  Current Medications: Current Outpatient Medications  Medication Sig Dispense Refill  . escitalopram (LEXAPRO) 20 MG tablet Take 1 tablet (20 mg total) by mouth daily. 30 tablet 1  . escitalopram (LEXAPRO) 10 MG tablet 5 mg daily for one week, then 10 mg daily 30 tablet 1   No current facility-administered medications for this visit.     Musculoskeletal: Strength & Muscle Tone: N/A Gait & Station: N.A Patient leans: N/A  Psychiatric Specialty Exam: Review of Systems  Psychiatric/Behavioral: Positive for sleep disturbance. Negative for agitation, behavioral problems, confusion, decreased concentration, dysphoric mood, hallucinations, self-injury and suicidal ideas. The patient is nervous/anxious. The patient is not hyperactive.    All other systems reviewed and are negative.   There were no vitals taken for this visit.There is no height or weight on file to calculate BMI.  General Appearance: NA  Eye Contact:  NA  Speech:  Clear and Coherent  Volume:  Normal  Mood:  Anxious  Affect:  NA  Thought Process:  Coherent  Orientation:  Full (Time, Place, and Person)  Thought Content: Logical   Suicidal Thoughts:  No  Homicidal Thoughts:  No  Memory:  Immediate;   Good  Judgement:  Good  Insight:  Good  Psychomotor Activity:  Normal  Concentration:  Concentration: Good and Attention Span: Good  Recall:  Good  Fund of Knowledge: Good  Language: Good  Akathisia:  No  Handed:  Right  AIMS (if indicated): not done  Assets:  Communication Skills Desire for Improvement  ADL's:  Intact  Cognition: WNL  Sleep:  Fair   Screenings: [facility] from 05/08/2019 in [facility]  PHQ-2 Total Score 3  PHQ-9 Total Score 8       Assessment and Plan:  MUAZ SHOREY is a 19 y.o. year old male with a history of anxiety, who presents for follow up appointment for below.   1. GAD (generalized anxiety disorder) R/o OCD He continues to report occasional anxiety with obsessive thoughts/compulsion, although there has been overall improvement since starting Lexapro.  We will uptitrate Lexapro to optimize treatment for anxiety.  He is aware of its potential GI side effects and worsening in SI in younger population.   Plan 1.Increase lexapro 20 mg daily    2.Next appointment-2/16 at 1 PM for 20 mins, video  Medication trials- sertraline/loose stools  The patient demonstrates the following risk factors for suicide: Chronic risk factors for suicide include:psychiatric disorder ofanxiety. Acute risk factorsfor suicide include: N/A. Protective factorsfor this patient include: positive social support and hope for the future. Considering these factors, the  overall suicide risk at this point appears to below. Patientisappropriate for outpatient follow up.  Neysa Hotter, MD 07/24/2020, 1:24 PM

## 2020-07-24 ENCOUNTER — Telehealth (INDEPENDENT_AMBULATORY_CARE_PROVIDER_SITE_OTHER): Payer: Medicaid Other | Admitting: Psychiatry

## 2020-07-24 ENCOUNTER — Other Ambulatory Visit: Payer: Self-pay

## 2020-07-24 ENCOUNTER — Encounter: Payer: Self-pay | Admitting: Psychiatry

## 2020-07-24 DIAGNOSIS — F411 Generalized anxiety disorder: Secondary | ICD-10-CM | POA: Diagnosis not present

## 2020-07-24 MED ORDER — ESCITALOPRAM OXALATE 20 MG PO TABS: 20.0000 mg | ORAL_TABLET | Freq: Every day | ORAL | 1 refills | Status: DC

## 2020-08-28 NOTE — Progress Notes (Signed)
Virtual Visit via Video Note  I connected with Nathan Bird on 09/03/20 at  1:00 PM EST by a video enabled telemedicine application and verified that I am speaking with the correct person using two identifiers.  Location: Patient: work Provider: office Persons participated in the visit- patient, provider   I discussed the limitations of evaluation and management by telemedicine and the availability of in person appointments. The patient expressed understanding and agreed to proceed.    I discussed the assessment and treatment plan with the patient. The patient was provided an opportunity to ask questions and all were answered. The patient agreed with the plan and demonstrated an understanding of the instructions.   The patient was advised to call back or seek an in-person evaluation if the symptoms worsen or if the condition fails to improve as anticipated.  I provided 11 minutes of non-face-to-face time during this encounter.   Neysa Hotter, MD   [facility] MD/PA/NP OP Progress Note  09/03/2020 1:18 PM Nathan Bird  MRN:  [id number]  Chief Complaint:  Chief Complaint    Follow-up; Anxiety     HPI:  This is a follow-up appointment for anxiety.  He states that he is doing better since the last visit.  There has been a change in management, and he likes it better.  He feels that he is in the right environment.  He enjoys more, being with his friends on weekends.  Although there are still a few times he felt anxious about uncertainty, it has been much better compared to before.  Although there were a few occasions he felt drained in the morning, he was able to handle it well, and it subsided afterwards.  He sleeps better.  He denies feeling depressed.  He has good concentration.  He has lost a few pounds since he has been more active at work and on weekends.  He denies SI.  He denies panic attacks.  Although he continues to overly check things at work, it has been improving.    285 lbs Wt Readings from Last 3 Encounters:  04/01/20 290 lb 3.2 oz (131.6 kg) (>99 %, Z= 2.96)*  07/02/19 300 lb (136.1 kg) (>99 %, Z= 3.04)*  05/29/19 (!) 301 lb (136.5 kg) (>99 %, Z= 3.05)*   * Growth percentiles are based on CDC (Boys, 2-20 Years) data.    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 Wanted to focus on work He describes as "nothing special" childhood. He has half brother and sister.    Visit Diagnosis:    ICD-10-CM   1. GAD (generalized anxiety disorder)  F41.1     Past Psychiatric History: Please see initial evaluation for full details. I have reviewed the history. No updates at this time.     Past Medical History:  Past Medical History:  Diagnosis Date  . Allergic rhinitis 12/08/2012  . Obesity, Class III, BMI 40-49.9 (morbid obesity) (HCC)   . Obesity, unspecified 12/08/2012    Past Surgical History:  Procedure Laterality Date  . TONSILLECTOMY      Family Psychiatric History: Please see initial evaluation for full details. I have reviewed the history. No updates at this time.     Family History:  Family History  Problem Relation Age of Onset  .  Diabetes Paternal Grandfather   . Stroke Paternal Grandfather   . Cancer Mother   . Depression Mother   . Depression Father   . Hypertension Father   . Diabetes Father        borderline  . Diabetes Maternal Grandmother   . Cancer Maternal Grandfather        renal  . Kidney disease Maternal Grandfather     Social History:  Social History   Socioeconomic History  . Marital status: Single    Spouse name: Not on file  . Number of children: Not on file  . Years of education: Not on file  . Highest education level: Not on file   Occupational History  . Not on file  Tobacco Use  . Smoking status: Passive Smoke Exposure - Never Smoker  . Smokeless tobacco: Never Used  Vaping Use  . Vaping Use: Every day  Substance and Sexual Activity  . Alcohol use: Yes    Comment: occ  . Drug use: No  . Sexual activity: Not on file  Other Topics Concern  . Not on file  Social History Narrative   Lives with both parents   Social Determinants of Health   Financial Resource Strain: Not on file  Food Insecurity: Not on file  Transportation Needs: Not on file  Physical Activity: Not on file  Stress: Not on file  Social Connections: Not on file    Allergies: No Known Allergies  Metabolic Disorder Labs: Lab Results  Component Value Date   HGBA1C 5.3 07/06/2016   MPG 100 02/02/2013   No results found for: PROLACTIN Lab Results  Component Value Date   CHOL 106 07/06/2016   TRIG 61 07/06/2016   HDL 37 (L) 07/06/2016   CHOLHDL 2.9 07/06/2016   LDLCALC 57 07/06/2016   Lab Results  Component Value Date   TSH 0.71 05/02/2020   TSH 0.850 07/06/2016    Therapeutic Level Labs: No results found for: LITHIUM No results found for: VALPROATE No components found for:  CBMZ  Current Medications: Current Outpatient Medications  Medication Sig Dispense Refill  . [START ON 09/21/2020] escitalopram (LEXAPRO) 20 MG tablet Take 1 tablet (20 mg total) by mouth daily. 30 tablet 2   No current facility-administered medications for this visit.     Musculoskeletal: Strength & Muscle Tone: N/A Gait & Station: N/A Patient leans: N/A  Psychiatric Specialty Exam: Review of Systems  Psychiatric/Behavioral: Negative for agitation, behavioral problems, confusion, decreased concentration, dysphoric mood, hallucinations, self-injury, sleep disturbance and suicidal ideas. The patient is nervous/anxious. The patient is not hyperactive.   All other systems reviewed and are negative.   There were no vitals taken for this  visit.There is no height or weight on file to calculate BMI.  General Appearance: Fairly Groomed  Eye Contact:  Good  Speech:  Clear and Coherent  Volume:  Normal  Mood:  better  Affect:  Appropriate, Congruent and euthymic  Thought Process:  Coherent  Orientation:  Full (Time, Place, and Person)  Thought Content: Logical   Suicidal Thoughts:  No  Homicidal Thoughts:  No  Memory:  Immediate;   Good  Judgement:  Good  Insight:  Good  Psychomotor Activity:  Normal  Concentration:  Concentration: Good and Attention Span: Good  Recall:  Good  Fund of Knowledge: Good  Language: Good  Akathisia:  No  Handed:  Right  AIMS (if indicated): not done  Assets:  Communication Skills Desire for Improvement  ADL's:  Intact  Cognition: WNL  Sleep:  Good   Screenings: PHQ2-9   Flowsheet Row Video Visit from 09/03/2020 in [facility] from 05/08/2019 in [facility]  PHQ-2 Total Score 1 3  PHQ-9 Total Score - 8    Flowsheet Row Video Visit from 09/03/2020 in [facility]  C-SSRS RISK CATEGORY No Risk       Assessment and Plan:  Nathan Bird is a 20 y.o. year old male with a history of anxiety,, who presents for follow up appointment for below.   1. GAD (generalized anxiety disorder) R/o OCD There has been significant improvement in and anxiety with obsessive thoughts/compulsion since up titration of Lexapro.  Will continue current dose as maintenance therapy for anxiety.  He is aware of its potential risk of worsening in SI in younger population.   Plan 1. Continue lexapro 20 mg daily   2.Next appointment-5/17 at 1 PM for 20 mins, video  Medication trials- sertraline/loose stools  The patient demonstrates the following risk factors for suicide: Chronic risk factors for suicide include:psychiatric disorder ofanxiety. Acute risk factorsfor suicide include: N/A. Protective  factorsfor this patient include: positive social support and hope for the future. Considering these factors, the overall suicide risk at this point appears to below. Patientisappropriate for outpatient follow up.   Neysa Hotter, MD 09/03/2020, 1:18 PM

## 2020-09-03 ENCOUNTER — Telehealth (INDEPENDENT_AMBULATORY_CARE_PROVIDER_SITE_OTHER): Payer: Medicaid Other | Admitting: Psychiatry

## 2020-09-03 ENCOUNTER — Other Ambulatory Visit: Payer: Self-pay

## 2020-09-03 ENCOUNTER — Encounter: Payer: Self-pay | Admitting: Psychiatry

## 2020-09-03 DIAGNOSIS — F411 Generalized anxiety disorder: Secondary | ICD-10-CM

## 2020-09-03 MED ORDER — ESCITALOPRAM OXALATE 20 MG PO TABS
20.0000 mg | ORAL_TABLET | Freq: Every day | ORAL | 2 refills | Status: DC
Start: 2020-09-21 — End: 2022-03-05

## 2020-09-03 NOTE — Patient Instructions (Signed)
1. Continue lexapro 20 mg daily   2.Next appointment-5/17 at 1 PM

## 2020-09-19 DIAGNOSIS — R310 Gross hematuria: Secondary | ICD-10-CM | POA: Diagnosis not present

## 2020-09-22 ENCOUNTER — Ambulatory Visit (INDEPENDENT_AMBULATORY_CARE_PROVIDER_SITE_OTHER): Payer: Medicaid Other | Admitting: Pediatrics

## 2020-09-22 ENCOUNTER — Encounter: Payer: Self-pay | Admitting: Pediatrics

## 2020-09-22 ENCOUNTER — Ambulatory Visit (HOSPITAL_COMMUNITY)
Admission: RE | Admit: 2020-09-22 | Discharge: 2020-09-22 | Disposition: A | Discharge status: Home / Self Care | Payer: Medicaid Other | Source: Ambulatory Visit | Attending: Pediatrics | Admitting: Pediatrics

## 2020-09-22 ENCOUNTER — Other Ambulatory Visit: Payer: Self-pay

## 2020-09-22 VITALS — BP 125/65 | Temp 97.9°F | Wt 289.6 lb

## 2020-09-22 DIAGNOSIS — R310 Gross hematuria: Secondary | ICD-10-CM

## 2020-09-22 DIAGNOSIS — J069 Acute upper respiratory infection, unspecified: Secondary | ICD-10-CM

## 2020-09-22 DIAGNOSIS — R109 Unspecified abdominal pain: Secondary | ICD-10-CM | POA: Diagnosis not present

## 2020-09-22 DIAGNOSIS — J029 Acute pharyngitis, unspecified: Secondary | ICD-10-CM | POA: Diagnosis not present

## 2020-09-22 DIAGNOSIS — R309 Painful micturition, unspecified: Secondary | ICD-10-CM | POA: Diagnosis not present

## 2020-09-22 LAB — POCT URINALYSIS DIPSTICK
Bilirubin, UA: NEGATIVE
Glucose, UA: NEGATIVE
Ketones, UA: NEGATIVE
Leukocytes, UA: NEGATIVE
Nitrite, UA: NEGATIVE
Protein, UA: NEGATIVE
Spec Grav, UA: 1.02 (ref 1.010–1.025)
Urobilinogen, UA: 0.2 E.U./dL
pH, UA: 6 (ref 5.0–8.0)

## 2020-09-22 LAB — POCT RAPID STREP A (OFFICE): Rapid Strep A Screen: NEGATIVE

## 2020-09-22 LAB — POCT INFLUENZA A/B
Influenza A, POC: NEGATIVE
Influenza B, POC: NEGATIVE

## 2020-09-22 LAB — POC SOFIA SARS ANTIGEN FIA: SARS:: NEGATIVE

## 2020-09-23 LAB — CBC WITH DIFFERENTIAL/PLATELET
Absolute Monocytes: 851 cells/uL (ref 200–950)
Basophils Absolute: 34 cells/uL (ref 0–200)
Basophils Relative: 0.4 %
Eosinophils Absolute: 112 cells/uL (ref 15–500)
Eosinophils Relative: 1.3 %
HCT: 44.5 % (ref 38.5–50.0)
Hemoglobin: 15.4 g/dL (ref 13.2–17.1)
Lymphs Abs: 2503 cells/uL (ref 850–3900)
MCH: 31.4 pg (ref 27.0–33.0)
MCHC: 34.6 g/dL (ref 32.0–36.0)
MCV: 90.6 fL (ref 80.0–100.0)
MPV: 9.8 fL (ref 7.5–12.5)
Monocytes Relative: 9.9 %
Neutro Abs: 5100 cells/uL (ref 1500–7800)
Neutrophils Relative %: 59.3 %
Platelets: 355 10*3/uL (ref 140–400)
RBC: 4.91 10*6/uL (ref 4.20–5.80)
RDW: 12.6 % (ref 11.0–15.0)
Total Lymphocyte: 29.1 %
WBC: 8.6 10*3/uL (ref 3.8–10.8)

## 2020-09-23 LAB — COMPREHENSIVE METABOLIC PANEL
AG Ratio: 1.9 (calc) (ref 1.0–2.5)
ALT: 12 U/L (ref 9–46)
AST: 15 U/L (ref 10–40)
Albumin: 4.9 g/dL (ref 3.6–5.1)
Alkaline phosphatase (APISO): 72 U/L (ref 36–130)
BUN: 10 mg/dL (ref 7–25)
CO2: 30 mmol/L (ref 20–32)
Calcium: 9.7 mg/dL (ref 8.6–10.3)
Chloride: 103 mmol/L (ref 98–110)
Creat: 0.73 mg/dL (ref 0.60–1.35)
Globulin: 2.6 g/dL (calc) (ref 1.9–3.7)
Glucose, Bld: 81 mg/dL (ref 65–139)
Potassium: 4.2 mmol/L (ref 3.5–5.3)
Sodium: 140 mmol/L (ref 135–146)
Total Bilirubin: 0.6 mg/dL (ref 0.2–1.2)
Total Protein: 7.5 g/dL (ref 6.1–8.1)

## 2020-09-23 LAB — HIGH SENSITIVITY CRP: hs-CRP: 2.1 mg/L

## 2020-09-23 LAB — ANTISTREPTOLYSIN O TITER: ASO: <50 IU/mL (ref <250)

## 2020-09-23 LAB — CALCIUM / CREATININE RATIO, URINE
CALCIUM, RANDOM URINE: 4.6 mg/dL
CALCIUM/CREATININE RATIO: 27 mg/g creat (ref 10–240)
Creatinine, Urine: 171 mg/dL (ref 20–320)

## 2020-09-23 LAB — SEDIMENTATION RATE: Sed Rate: 2 mm/h (ref 0–15)

## 2020-09-24 LAB — URINE CULTURE
MICRO NUMBER:: 11618968
SPECIMEN QUALITY:: ADEQUATE

## 2020-09-24 LAB — CULTURE, GROUP A STREP
MICRO NUMBER:: 11616575
SPECIMEN QUALITY:: ADEQUATE

## 2020-09-24 LAB — C. TRACHOMATIS/N. GONORRHOEAE RNA
C. trachomatis RNA, TMA: NOT DETECTED
N. gonorrhoeae RNA, TMA: NOT DETECTED

## 2020-09-25 ENCOUNTER — Encounter: Payer: Self-pay | Admitting: Pediatrics

## 2020-09-25 NOTE — Progress Notes (Signed)
Urgent Subjective:     Patient ID: Nathan Bird, male   DOB: Jul 23, 2000, 20 y.o.   MRN: [id number]  Chief Complaint  Patient presents with  . Blood in urine    HPI: Patient is here for follow-up.  He was evaluated at [facility] urgent care for gross hematuria.  He was evaluated on 09/19/2020 for gross hematuria.  Per notes, patient's urine was noted to be brown in color, protein 100+, 3+ blood, 75 leukocyte esterase, 122 WBCs and 5144 RBCs.  No casts were present and urine culture was pending.  Patient was referred to family practice however, patient was not aware of this.  There is also note that states that the patient will be referred to urology at [facility], again he is not aware of this.  Patient states per history, that he had painless hematuria.  He states he gone to the bathroom and saw thick red urine.  He did not have any trauma.  He denies any sexual activity.  He denies any family history of kidney stones.  He states that he does have cough symptoms and sore throat 1 week prior to the onset of the symptoms.  He states that he has had mild "twinge" on his left mid quadrant, however it is not painful.  He has not had any vomiting nor any diarrhea.  He has not seen any blood in his urine since the initial event.  Past Medical History:  Diagnosis Date  . Allergic rhinitis 12/08/2012  . Obesity, Class III, BMI 40-49.9 (morbid obesity) (HCC)   . Obesity, unspecified 12/08/2012     Family History  Problem Relation Age of Onset  . Diabetes Paternal Grandfather   . Stroke Paternal Grandfather   . Cancer Mother   . Depression Mother   . Depression Father   . Hypertension Father   . Diabetes Father        borderline  . Diabetes Maternal Grandmother   . Cancer Maternal Grandfather        renal  . Kidney disease Maternal Grandfather     Social History   Tobacco Use  . Smoking status: Passive Smoke Exposure - Never Smoker  . Smokeless tobacco: Never Used  Substance Use  Topics  . Alcohol use: Yes    Comment: occ   Social History   Social History Narrative   Lives with both parents    Outpatient Encounter Medications as of 09/22/2020  Medication Sig  . escitalopram (LEXAPRO) 20 MG tablet Take 1 tablet (20 mg total) by mouth daily.   No facility-administered encounter medications on file as of 09/22/2020.    Patient has no known allergies.    ROS:  Apart from the symptoms reviewed above, there are no other symptoms referable to all systems reviewed.   Physical Examination   Wt Readings from Last 3 Encounters:  09/22/20 289 lb 9.6 oz (131.4 kg)  04/01/20 290 lb 3.2 oz (131.6 kg) (>99 %, Z= 2.96)*  07/02/19 300 lb (136.1 kg) (>99 %, Z= 3.04)*   * Growth percentiles are based on CDC (Boys, 2-20 Years) data.   BP Readings from Last 3 Encounters:  09/22/20 125/65  04/03/20 108/70  07/02/19 122/67   Body mass index is 42.77 kg/m. Facility age limit for growth percentiles is 20 years. Growth percentile SmartLinks can only be used for patients less than 27 years old. Pulse Readings from Last 3 Encounters:  04/03/20 (!) 57  07/02/19 76  05/29/19 64  97.9 F (36.6 C) (Skin)  Current Encounter SPO2  04/03/20 1147 99%      General: Alert, NAD,  HEENT: TM's - clear, Throat - clear, Neck - FROM, no meningismus, Sclera - clear LYMPH NODES: No lymphadenopathy noted LUNGS: Clear to auscultation bilaterally,  no wheezing or crackles noted CV: RRR without Murmurs ABD: Soft, NT, positive bowel signs,  No hepatosplenomegaly noted, no peritoneal signs.  No costovertebral tenderness. GU: Not examined (per patient examined in the urgent care and no concerns) SKIN: Clear, No rashes noted, no bruising noted. NEUROLOGICAL: Grossly intact MUSCULOSKELETAL: Not examined Psychiatric: Affect normal, non-anxious   Rapid Strep A Screen  Date Value Ref Range Status  09/22/2020 Negative Negative Final     DG Abd 1 View  Result Date:  09/23/2020 CLINICAL DATA:  Gross hematuria, left abdominal pain EXAM: ABDOMEN - 1 VIEW COMPARISON:  Radiograph 09/30/2005 FINDINGS: The bowel gas pattern is normal. No radio-opaque calculi or other significant radiographic abnormality are seen. IMPRESSION: Negative. Electronically Signed   By: Kreg Shropshire M.D.   On: 09/23/2020 03:07    Recent Results (from the past 240 hour(s))  Urine Culture     Status: None   Collection Time: 09/22/20  2:45 PM   Specimen: Urine  Result Value Ref Range Status   MICRO NUMBER: [id number]  Final   SPECIMEN QUALITY: Adequate  Final   Sample Source URINE  Final   STATUS: FINAL  Final   ISOLATE 1:   Final    Less than 10,000 CFU/mL of single Gram positive organism isolated. No further testing will be performed. If clinically indicated, recollection using a method to minimize contamination, with prompt transfer to Urine Culture Transport Tube, is recommended.  C. trachomatis/N. gonorrhoeae RNA     Status: None   Collection Time: 09/22/20  2:45 PM   Specimen: Urine  Result Value Ref Range Status   C. trachomatis RNA, TMA NOT DETECTED NOT DETECT Final   N. gonorrhoeae RNA, TMA NOT DETECTED NOT DETECT Final    Comment: The analytical performance characteristics of this assay, when used to test SurePath(TM) specimens have been determined by [employer]. The modifications have not been cleared or approved by the FDA. This assay has been validated pursuant to the CLIA regulations and is used for clinical purposes. . For additional information, please refer to https://education.questdiagnostics.com/faq/FAQ154 (This link is being provided for information/ educational purposes only.) .   Culture, Group A Strep     Status: None   Collection Time: 09/22/20  4:50 PM   Specimen: Throat  Result Value Ref Range Status   MICRO NUMBER: [id number]  Final   SPECIMEN QUALITY: Adequate  Final   SOURCE: NOT GIVEN  Final   STATUS: FINAL  Final   RESULT: No group A  Streptococcus isolated  Final    No results found for this or any previous visit (from the past 48 hour(s)).  Assessment:  1. Painful urination  2. Hematuria, gross    Plan:   1.  Due to the gross hematuria that was present initially as well as the abnormal urinalysis, urine is obtained in the office today.  Urinalysis is within normal limits apart from 3+ blood present.  Also urine is sent off for GC and chlamydia to rule out any infectious causes.   2.  Patient is also given a requisition form to have blood work performed which includes CBC with differential, CMP, sed rate, ASO as well as CRP. 3.  Flu testing,  and Covid testing performed secondary to symptoms of cough and URI 1 week prior to the onset of gross hematuria.  Also rapid strep performed in the office secondary to complaints of sore throat.  The results are all negative.  Urine culture is pending as well as strep culture. 3.  Secondary to concerns of left quadrant abdominal pain, will obtain an abdomen film.  Discussed at length with patient, the abdominal film may or may not show any calcifications.  If he continues to have abdominal pain or worsening of the pain, he needs to be in the ER for further evaluation.  He would likely require a CT to rule out renal stones or he may require abdominal ultrasounds as well. 4.  Patient will require a referral to urology as well.  We will go ahead and have this set up for him.  Discussed at length with patient, that he also needs to obtain a adult PCP who would help to follow him and coordinate his care given that he is 20 years of age now.  Patient is given list of all the providers in our area who are excepting patients. 5.  Discussed at length with patient the potential causes of gross hematuria.  Also as to what further evaluation would be needed after we have completed the above in order to determine this.  All questions were answered to the best of my abilities.  I will call the patient  with the results once I receive them. Patient is given strict precautions as to when he needs to go to the ER. Spent 30 minutes with the patient face-to-face of which over 50% was in counseling in regards to evaluation and treatment of gross hematuria.  No orders of the defined types were placed in this encounter.

## 2020-09-28 ENCOUNTER — Emergency Department (HOSPITAL_COMMUNITY): Payer: Medicaid Other

## 2020-09-28 ENCOUNTER — Other Ambulatory Visit: Payer: Self-pay

## 2020-09-28 ENCOUNTER — Encounter (HOSPITAL_COMMUNITY): Payer: Self-pay | Admitting: *Deleted

## 2020-09-28 ENCOUNTER — Emergency Department (HOSPITAL_COMMUNITY)
Admission: EM | Admit: 2020-09-28 | Discharge: 2020-09-28 | Disposition: A | Discharge status: Home / Self Care | Payer: Medicaid Other | Attending: Emergency Medicine | Admitting: Emergency Medicine

## 2020-09-28 ENCOUNTER — Telehealth (HOSPITAL_COMMUNITY): Payer: Self-pay | Admitting: Physician Assistant

## 2020-09-28 DIAGNOSIS — N23 Unspecified renal colic: Secondary | ICD-10-CM | POA: Diagnosis not present

## 2020-09-28 DIAGNOSIS — Z7722 Contact with and (suspected) exposure to environmental tobacco smoke (acute) (chronic): Secondary | ICD-10-CM | POA: Insufficient documentation

## 2020-09-28 DIAGNOSIS — N132 Hydronephrosis with renal and ureteral calculous obstruction: Secondary | ICD-10-CM | POA: Diagnosis not present

## 2020-09-28 DIAGNOSIS — N201 Calculus of ureter: Secondary | ICD-10-CM | POA: Diagnosis not present

## 2020-09-28 DIAGNOSIS — R109 Unspecified abdominal pain: Secondary | ICD-10-CM | POA: Diagnosis present

## 2020-09-28 DIAGNOSIS — N200 Calculus of kidney: Secondary | ICD-10-CM | POA: Diagnosis not present

## 2020-09-28 LAB — BASIC METABOLIC PANEL
Anion gap: 10 (ref 5–15)
BUN: 8 mg/dL (ref 6–20)
CO2: 27 mmol/L (ref 22–32)
Calcium: 8.3 mg/dL — ABNORMAL LOW (ref 8.9–10.3)
Chloride: 102 mmol/L (ref 98–111)
Creatinine, Ser: 0.71 mg/dL (ref 0.61–1.24)
GFR, Estimated: >60 mL/min (ref >60)
Glucose, Bld: 110 mg/dL — ABNORMAL HIGH (ref 70–99)
Potassium: 3.4 mmol/L — ABNORMAL LOW (ref 3.5–5.1)
Sodium: 139 mmol/L (ref 135–145)

## 2020-09-28 LAB — CBC WITH DIFFERENTIAL/PLATELET
Abs Immature Granulocytes: 0.02 10*3/uL (ref 0.00–0.07)
Basophils Absolute: 0 10*3/uL (ref 0.0–0.1)
Basophils Relative: 1 %
Eosinophils Absolute: 0.2 10*3/uL (ref 0.0–0.5)
Eosinophils Relative: 3 %
HCT: 41.3 % (ref 39.0–52.0)
Hemoglobin: 14.1 g/dL (ref 13.0–17.0)
Immature Granulocytes: 0 %
Lymphocytes Relative: 29 %
Lymphs Abs: 2 10*3/uL (ref 0.7–4.0)
MCH: 31.7 pg (ref 26.0–34.0)
MCHC: 34.1 g/dL (ref 30.0–36.0)
MCV: 92.8 fL (ref 80.0–100.0)
Monocytes Absolute: 0.7 10*3/uL (ref 0.1–1.0)
Monocytes Relative: 10 %
Neutro Abs: 3.8 10*3/uL (ref 1.7–7.7)
Neutrophils Relative %: 57 %
Platelets: 237 10*3/uL (ref 150–400)
RBC: 4.45 MIL/uL (ref 4.22–5.81)
RDW: 12.6 % (ref 11.5–15.5)
WBC: 6.7 10*3/uL (ref 4.0–10.5)
nRBC: 0 % (ref 0.0–0.2)

## 2020-09-28 LAB — URINALYSIS, ROUTINE W REFLEX MICROSCOPIC
Bilirubin Urine: NEGATIVE
Glucose, UA: NEGATIVE mg/dL
Ketones, ur: NEGATIVE mg/dL
Leukocytes,Ua: NEGATIVE
Nitrite: NEGATIVE
Protein, ur: 100 mg/dL — AB
RBC / HPF: >50 RBC/hpf — ABNORMAL HIGH (ref 0–5)
Specific Gravity, Urine: 1.023 (ref 1.005–1.030)
pH: 6 (ref 5.0–8.0)

## 2020-09-28 MED ORDER — OXYCODONE-ACETAMINOPHEN 5-325 MG PO TABS: 1.0000 | ORAL_TABLET | ORAL | 0 refills | Status: DC | PRN

## 2020-09-28 MED ORDER — HYDROMORPHONE HCL 1 MG/ML IJ SOLN
1.0000 mg | Freq: Once | INTRAMUSCULAR | Status: AC
  Administered 2020-09-28: 1 mg via INTRAMUSCULAR
  Filled 2020-09-28: qty 1

## 2020-09-28 MED ORDER — TAMSULOSIN HCL 0.4 MG PO CAPS: 0.4000 mg | ORAL_CAPSULE | Freq: Every day | ORAL | 0 refills | Status: DC

## 2020-09-28 MED ORDER — OXYCODONE-ACETAMINOPHEN 5-325 MG PO TABS: 2.0000 | ORAL_TABLET | ORAL | 0 refills | Status: DC | PRN

## 2020-09-28 MED ORDER — KETOROLAC TROMETHAMINE 60 MG/2ML IM SOLN
60.0000 mg | Freq: Once | INTRAMUSCULAR | Status: AC
  Administered 2020-09-28: 60 mg via INTRAMUSCULAR
  Filled 2020-09-28: qty 2

## 2020-09-28 MED ORDER — ONDANSETRON 4 MG PO TBDP: 4.0000 mg | ORAL_TABLET | Freq: Three times a day (TID) | ORAL | 0 refills | Status: DC | PRN

## 2020-09-28 MED ORDER — ONDANSETRON 4 MG PO TBDP
4.0000 mg | ORAL_TABLET | Freq: Once | ORAL | Status: AC
  Administered 2020-09-28: 4 mg via ORAL
  Filled 2020-09-28: qty 1

## 2020-09-28 NOTE — Telephone Encounter (Signed)
Pharmacy changed from [facility] to [facility]

## 2020-09-28 NOTE — ED Provider Notes (Signed)
[facility] EMERGENCY DEPARTMENT Provider Note   CSN: [id number] Arrival date & time: 09/28/20  0857     History Chief Complaint  Patient presents with  . Flank Pain    Nathan Bird is a 20 y.o. male.  The history is provided by the patient. No language interpreter was used.  Flank Pain This is a new problem. The current episode started more than 1 week ago. The problem occurs constantly. The problem has been rapidly worsening. Nothing aggravates the symptoms. Nothing relieves the symptoms. He has tried nothing for the symptoms. The treatment provided no relief.   Pt is scheduled to see Urology tomorrow     Past Medical History:  Diagnosis Date  . Allergic rhinitis 12/08/2012  . Obesity, Class III, BMI 40-49.9 (morbid obesity) (HCC)   . Obesity, unspecified 12/08/2012    Patient Active Problem List   Diagnosis Date Noted  . Headache(784.0) 06/26/2013  . Nasal congestion 06/26/2013  . Allergic rhinitis 12/08/2012  . Class 3 severe obesity due to excess calories with body mass index (BMI) of 45.0 to 49.9 in adult [facility]) 12/08/2012    Past Surgical History:  Procedure Laterality Date  . TONSILLECTOMY         Family History  Problem Relation Age of Onset  . Diabetes Paternal Grandfather   . Stroke Paternal Grandfather   . Cancer Mother   . Depression Mother   . Depression Father   . Hypertension Father   . Diabetes Father        borderline  . Diabetes Maternal Grandmother   . Cancer Maternal Grandfather        renal  . Kidney disease Maternal Grandfather     Social History   Tobacco Use  . Smoking status: Passive Smoke Exposure - Never Smoker  . Smokeless tobacco: Never Used  Vaping Use  . Vaping Use: Every day  Substance Use Topics  . Alcohol use: Yes    Comment: occ  . Drug use: Yes    Types: Marijuana    Home Medications Prior to Admission medications   Medication Sig Start Date End Date Taking? Authorizing Provider  escitalopram  (LEXAPRO) 20 MG tablet Take 1 tablet (20 mg total) by mouth daily. 09/21/20  Yes Neysa Hotter, MD  ibuprofen (ADVIL) 200 MG tablet Take 400 mg by mouth every 8 (eight) hours as needed for mild pain.   Yes [provider]  ondansetron (ZOFRAN ODT) 4 MG disintegrating tablet Take 1 tablet (4 mg total) by mouth every 8 (eight) hours as needed for nausea or vomiting. 09/28/20  Yes Elson Areas, PA-C  oxyCODONE-acetaminophen (PERCOCET) 5-325 MG tablet Take 1 tablet by mouth every 4 (four) hours as needed for severe pain. 09/28/20 09/28/21 Yes Elson Areas, PA-C  tamsulosin (FLOMAX) 0.4 MG CAPS capsule Take 1 capsule (0.4 mg total) by mouth daily. 09/28/20  Yes Elson Areas, PA-C    Allergies    Patient has no known allergies.  Review of Systems   Review of Systems  Genitourinary: Positive for flank pain.  All other systems reviewed and are negative.   Physical Exam Updated Vital Signs BP 127/86   Pulse (!) 52   Temp 97.9 F (36.6 C) (Oral)   Resp 18   Ht 5\' 10"  (1.778 m)   Wt 127 kg   SpO2 100%   BMI 40.18 kg/m   Physical Exam Vitals and nursing note reviewed.  Constitutional:      Appearance: He  is well-developed.  HENT:     Head: Normocephalic and atraumatic.  Eyes:     Conjunctiva/sclera: Conjunctivae normal.  Cardiovascular:     Rate and Rhythm: Normal rate and regular rhythm.     Heart sounds: No murmur heard.   Pulmonary:     Effort: Pulmonary effort is normal. No respiratory distress.     Breath sounds: Normal breath sounds.  Musculoskeletal:     Cervical back: Neck supple.  Skin:    General: Skin is warm and dry.  Neurological:     General: No focal deficit present.     Mental Status: He is alert.  Psychiatric:        Mood and Affect: Mood normal.     ED Results / Procedures / Treatments   Labs (all labs ordered are listed, but only abnormal results are displayed) Labs Reviewed  URINALYSIS, ROUTINE W REFLEX MICROSCOPIC - Abnormal; Notable  for the following components:      Result Value   APPearance HAZY (*)    Hgb urine dipstick LARGE (*)    Protein, ur 100 (*)    RBC / HPF >50 (*)    Bacteria, UA RARE (*)    All other components within normal limits  BASIC METABOLIC PANEL - Abnormal; Notable for the following components:   Potassium 3.4 (*)    Glucose, Bld 110 (*)    Calcium 8.3 (*)    All other components within normal limits  CBC WITH DIFFERENTIAL/PLATELET    EKG None  Radiology CT Renal Stone Study  Result Date: 09/28/2020 CLINICAL DATA:  Right flank pain. EXAM: CT ABDOMEN AND PELVIS WITHOUT CONTRAST TECHNIQUE: Multidetector CT imaging of the abdomen and pelvis was performed following the standard protocol without IV contrast. COMPARISON:  None. FINDINGS: Lower chest: No acute abnormality. Hepatobiliary: No focal liver abnormality is seen. No gallstones, gallbladder wall thickening, or biliary dilatation. Pancreas: Unremarkable. No pancreatic ductal dilatation or surrounding inflammatory changes. Spleen: Normal in size without focal abnormality. Adrenals/Urinary Tract: Adrenal glands are normal. No renal stones. There is mild hydronephrosis on the right due to a 2 mm stone in the proximal right ureter seen on coronal image 55. The remainder of the right ureter is normal. The bladder is decompressed limiting evaluation without abnormality identified. The left kidney and ureter are unremarkable. Stomach/Bowel: Stomach is within normal limits. Appendix appears normal. No evidence of bowel wall thickening, distention, or inflammatory changes. Vascular/Lymphatic: No significant vascular findings are present. No enlarged abdominal or pelvic lymph nodes. Reproductive: Prostate is unremarkable. Other: No abdominal wall hernia or abnormality. No abdominopelvic ascites. Musculoskeletal: No acute or significant osseous findings. IMPRESSION: 1. There is a 2 mm stone in the proximal right ureter resulting in mild hydronephrosis. 2. No  other abnormalities are identified. Electronically Signed   By: Gerome Sam III M.D   On: 09/28/2020 09:57    Procedures Procedures   Medications Ordered in ED Medications  ketorolac (TORADOL) injection 60 mg (60 mg Intramuscular Given 09/28/20 1125)  HYDROmorphone (DILAUDID) injection 1 mg (1 mg Intramuscular Given 09/28/20 1125)  ondansetron (ZOFRAN-ODT) disintegrating tablet 4 mg (4 mg Oral Given 09/28/20 1125)    ED Course  I have reviewed the triage vital signs and the nursing notes.  Pertinent labs & imaging results that were available during my care of the patient were reviewed by me and considered in my medical decision making (see chart for details).    MDM Rules/Calculators/A&P  MDM:  Ct scan shows a 26mm stone right proximal ureter.  Pt given toradol. Dilaudid and zofran.  Pt advised to follow up with urology as scheduled  Final Clinical Impression(s) / ED Diagnoses Final diagnoses:  None    Rx / DC Orders ED Discharge Orders         Ordered    oxyCODONE-acetaminophen (PERCOCET) 5-325 MG tablet  Every 4 hours PRN        09/28/20 1138    tamsulosin (FLOMAX) 0.4 MG CAPS capsule  Daily        09/28/20 1138    ondansetron (ZOFRAN ODT) 4 MG disintegrating tablet  Every 8 hours PRN        09/28/20 1138        An After Visit Summary was printed and given to the patient.    Elson Areas, PA-C 09/28/20 1149    Horton, Clabe Seal, DO 09/28/20 1511

## 2020-09-28 NOTE — Discharge Instructions (Signed)
Follow up with the Urologist tomorrow as scheduled

## 2020-09-28 NOTE — ED Triage Notes (Signed)
Pt c/o right flank pain that started this morning along with hematuria x weeks.

## 2020-09-29 ENCOUNTER — Ambulatory Visit: Payer: Medicaid Other | Admitting: Urology

## 2020-10-23 ENCOUNTER — Emergency Department (HOSPITAL_COMMUNITY)
Admission: EM | Admit: 2020-10-23 | Discharge: 2020-10-23 | Disposition: A | Discharge status: Home / Self Care | Payer: Medicaid Other | Attending: Emergency Medicine | Admitting: Emergency Medicine

## 2020-10-23 ENCOUNTER — Emergency Department (HOSPITAL_COMMUNITY): Payer: Medicaid Other

## 2020-10-23 ENCOUNTER — Encounter (HOSPITAL_COMMUNITY): Payer: Self-pay | Admitting: *Deleted

## 2020-10-23 ENCOUNTER — Other Ambulatory Visit: Payer: Self-pay

## 2020-10-23 DIAGNOSIS — N200 Calculus of kidney: Secondary | ICD-10-CM | POA: Diagnosis not present

## 2020-10-23 DIAGNOSIS — N132 Hydronephrosis with renal and ureteral calculous obstruction: Secondary | ICD-10-CM | POA: Diagnosis not present

## 2020-10-23 DIAGNOSIS — R109 Unspecified abdominal pain: Secondary | ICD-10-CM | POA: Diagnosis not present

## 2020-10-23 DIAGNOSIS — R10A0 Flank pain, unspecified side: Secondary | ICD-10-CM

## 2020-10-23 DIAGNOSIS — Z7722 Contact with and (suspected) exposure to environmental tobacco smoke (acute) (chronic): Secondary | ICD-10-CM | POA: Insufficient documentation

## 2020-10-23 HISTORY — DX: Calculus of kidney: N20.0

## 2020-10-23 LAB — CBC WITH DIFFERENTIAL/PLATELET
Abs Immature Granulocytes: 0.03 10*3/uL (ref 0.00–0.07)
Basophils Absolute: 0.1 10*3/uL (ref 0.0–0.1)
Basophils Relative: 1 %
Eosinophils Absolute: 0.3 10*3/uL (ref 0.0–0.5)
Eosinophils Relative: 4 %
HCT: 45.3 % (ref 39.0–52.0)
Hemoglobin: 15.1 g/dL (ref 13.0–17.0)
Immature Granulocytes: 1 %
Lymphocytes Relative: 28 %
Lymphs Abs: 1.8 10*3/uL (ref 0.7–4.0)
MCH: 31 pg (ref 26.0–34.0)
MCHC: 33.3 g/dL (ref 30.0–36.0)
MCV: 93 fL (ref 80.0–100.0)
Monocytes Absolute: 0.7 10*3/uL (ref 0.1–1.0)
Monocytes Relative: 11 %
Neutro Abs: 3.7 10*3/uL (ref 1.7–7.7)
Neutrophils Relative %: 55 %
Platelets: 257 10*3/uL (ref 150–400)
RBC: 4.87 MIL/uL (ref 4.22–5.81)
RDW: 12.7 % (ref 11.5–15.5)
WBC: 6.6 10*3/uL (ref 4.0–10.5)
nRBC: 0 % (ref 0.0–0.2)

## 2020-10-23 LAB — COMPREHENSIVE METABOLIC PANEL
ALT: 15 U/L (ref 0–44)
AST: 19 U/L (ref 15–41)
Albumin: 4.7 g/dL (ref 3.5–5.0)
Alkaline Phosphatase: 65 U/L (ref 38–126)
Anion gap: 11 (ref 5–15)
BUN: 14 mg/dL (ref 6–20)
CO2: 23 mmol/L (ref 22–32)
Calcium: 9.1 mg/dL (ref 8.9–10.3)
Chloride: 104 mmol/L (ref 98–111)
Creatinine, Ser: 0.81 mg/dL (ref 0.61–1.24)
GFR, Estimated: >60 mL/min (ref >60)
Glucose, Bld: 117 mg/dL — ABNORMAL HIGH (ref 70–99)
Potassium: 4 mmol/L (ref 3.5–5.1)
Sodium: 138 mmol/L (ref 135–145)
Total Bilirubin: 0.6 mg/dL (ref 0.3–1.2)
Total Protein: 7.7 g/dL (ref 6.5–8.1)

## 2020-10-23 LAB — URINALYSIS, ROUTINE W REFLEX MICROSCOPIC
Bilirubin Urine: NEGATIVE
Glucose, UA: NEGATIVE mg/dL
Ketones, ur: NEGATIVE mg/dL
Leukocytes,Ua: NEGATIVE
Nitrite: NEGATIVE
Protein, ur: 100 mg/dL — AB
RBC / HPF: >50 RBC/hpf — ABNORMAL HIGH (ref 0–5)
Specific Gravity, Urine: 1.023 (ref 1.005–1.030)
pH: 7 (ref 5.0–8.0)

## 2020-10-23 MED ORDER — KETOROLAC TROMETHAMINE 30 MG/ML IJ SOLN
30.0000 mg | Freq: Once | INTRAMUSCULAR | Status: AC
  Administered 2020-10-23: 30 mg via INTRAVENOUS
  Filled 2020-10-23: qty 1

## 2020-10-23 MED ORDER — HYDROMORPHONE HCL 1 MG/ML IJ SOLN
1.0000 mg | Freq: Once | INTRAMUSCULAR | Status: AC
  Administered 2020-10-23: 1 mg via INTRAVENOUS
  Filled 2020-10-23: qty 1

## 2020-10-23 MED ORDER — OXYCODONE-ACETAMINOPHEN 5-325 MG PO TABS: 1.0000 | ORAL_TABLET | Freq: Four times a day (QID) | ORAL | 0 refills | Status: DC | PRN

## 2020-10-23 MED ORDER — ONDANSETRON 4 MG PO TBDP
ORAL_TABLET | ORAL | 0 refills | Status: DC
Start: 2020-10-23 — End: 2020-10-23

## 2020-10-23 MED ORDER — TAMSULOSIN HCL 0.4 MG PO CAPS: 0.4000 mg | ORAL_CAPSULE | Freq: Every day | ORAL | 0 refills | Status: DC

## 2020-10-23 MED ORDER — ONDANSETRON HCL 4 MG/2ML IJ SOLN
4.0000 mg | Freq: Once | INTRAMUSCULAR | Status: AC
  Administered 2020-10-23: 4 mg via INTRAVENOUS
  Filled 2020-10-23: qty 2

## 2020-10-23 MED ORDER — ONDANSETRON 4 MG PO TBDP
ORAL_TABLET | ORAL | 0 refills | Status: DC
Start: 2020-10-23 — End: 2022-03-05

## 2020-10-23 NOTE — ED Triage Notes (Signed)
Pt c/o difficulty urinating that started yesterday and then this morning started having pain in his right flank/side. Feels as if he has to urinate but can't get it out. Denies hematuria. Reports the pain is similar to when he has had kidney stones in the past.

## 2020-10-23 NOTE — Discharge Instructions (Addendum)
Drink plenty of fluids and follow-up with [facility] next week

## 2020-10-23 NOTE — ED Provider Notes (Signed)
[facility] EMERGENCY DEPARTMENT Provider Note   CSN: [id number] Arrival date & time: 10/23/20  0802     History Chief Complaint  Patient presents with  . Flank Pain    Nathan Bird is a 20 y.o. male.  Patient complains of right flank pain.  Patient has a history of a kidney stone.  The history is provided by the patient and medical records. No language interpreter was used.  Flank Pain This is a new problem. The current episode started yesterday. The problem occurs constantly. The problem has not changed since onset.Pertinent negatives include no chest pain, no abdominal pain and no headaches. Nothing aggravates the symptoms. Nothing relieves the symptoms.       Past Medical History:  Diagnosis Date  . Allergic rhinitis 12/08/2012  . Kidney stones   . Obesity, Class III, BMI 40-49.9 (morbid obesity) (HCC)   . Obesity, unspecified 12/08/2012    Patient Active Problem List   Diagnosis Date Noted  . Headache(784.0) 06/26/2013  . Nasal congestion 06/26/2013  . Allergic rhinitis 12/08/2012  . Class 3 severe obesity due to excess calories with body mass index (BMI) of 45.0 to 49.9 in adult [facility]) 12/08/2012    Past Surgical History:  Procedure Laterality Date  . TONSILLECTOMY         Family History  Problem Relation Age of Onset  . Diabetes Paternal Grandfather   . Stroke Paternal Grandfather   . Cancer Mother   . Depression Mother   . Depression Father   . Hypertension Father   . Diabetes Father        borderline  . Diabetes Maternal Grandmother   . Cancer Maternal Grandfather        renal  . Kidney disease Maternal Grandfather     Social History   Tobacco Use  . Smoking status: Passive Smoke Exposure - Never Smoker  . Smokeless tobacco: Never Used  Vaping Use  . Vaping Use: Every day  Substance Use Topics  . Alcohol use: Yes    Comment: occ  . Drug use: Yes    Types: Marijuana    Home Medications Prior to Admission medications    Medication Sig Start Date End Date Taking? Authorizing Provider  escitalopram (LEXAPRO) 20 MG tablet Take 1 tablet (20 mg total) by mouth daily. 09/21/20  Yes Neysa Hotter, MD  ondansetron (ZOFRAN ODT) 4 MG disintegrating tablet 4mg  ODT q4 hours prn nausea/vomit 10/23/20   12/23/20, MD  oxyCODONE-acetaminophen (PERCOCET) 5-325 MG tablet Take 1 tablet by mouth every 6 (six) hours as needed. 10/23/20   12/23/20, MD  tamsulosin (FLOMAX) 0.4 MG CAPS capsule Take 1 capsule (0.4 mg total) by mouth daily. 10/23/20   12/23/20, MD    Allergies    Patient has no known allergies.  Review of Systems   Review of Systems  Constitutional: Negative for appetite change and fatigue.  HENT: Negative for congestion, ear discharge and sinus pressure.   Eyes: Negative for discharge.  Respiratory: Negative for cough.   Cardiovascular: Negative for chest pain.  Gastrointestinal: Negative for abdominal pain and diarrhea.  Genitourinary: Positive for flank pain. Negative for frequency and hematuria.  Musculoskeletal: Negative for back pain.  Skin: Negative for rash.  Neurological: Negative for seizures and headaches.  Psychiatric/Behavioral: Negative for hallucinations.    Physical Exam Updated Vital Signs BP (!) 148/80   Pulse 82   Temp 98.1 F (36.7 C) (Oral)   Resp 17   Ht 5\' 10"  (  1.778 m)   Wt 127 kg   SpO2 99%   BMI 40.18 kg/m   Physical Exam Vitals and nursing note reviewed.  Constitutional:      Appearance: He is well-developed.  HENT:     Head: Normocephalic.  Eyes:     General: No scleral icterus.    Conjunctiva/sclera: Conjunctivae normal.  Neck:     Thyroid: No thyromegaly.  Cardiovascular:     Rate and Rhythm: Normal rate and regular rhythm.     Heart sounds: No murmur heard. No friction rub. No gallop.   Pulmonary:     Breath sounds: No stridor. No wheezing or rales.  Chest:     Chest wall: No tenderness.  Abdominal:     General: There is no distension.      Tenderness: There is no abdominal tenderness. There is no rebound.  Musculoskeletal:        General: Normal range of motion.     Cervical back: Neck supple.     Comments: Tender right flank  Lymphadenopathy:     Cervical: No cervical adenopathy.  Skin:    Findings: No erythema or rash.  Neurological:     Mental Status: He is alert and oriented to person, place, and time.     Motor: No abnormal muscle tone.     Coordination: Coordination normal.  Psychiatric:        Behavior: Behavior normal.     ED Results / Procedures / Treatments   Labs (all labs ordered are listed, but only abnormal results are displayed) Labs Reviewed  COMPREHENSIVE METABOLIC PANEL - Abnormal; Notable for the following components:      Result Value   Glucose, Bld 117 (*)    All other components within normal limits  URINALYSIS, ROUTINE W REFLEX MICROSCOPIC - Abnormal; Notable for the following components:   APPearance HAZY (*)    Hgb urine dipstick MODERATE (*)    Protein, ur 100 (*)    RBC / HPF >50 (*)    Bacteria, UA FEW (*)    All other components within normal limits  CBC WITH DIFFERENTIAL/PLATELET    EKG None  Radiology CT Renal Stone Study  Result Date: 10/23/2020 CLINICAL DATA:  Flank pain, suspect kidney stone EXAM: CT ABDOMEN AND PELVIS WITHOUT CONTRAST TECHNIQUE: Multidetector CT imaging of the abdomen and pelvis was performed following the standard protocol without IV contrast. COMPARISON:  Recent prior renal stone study 09/18/2020 FINDINGS: Lower chest: No acute abnormality. Hepatobiliary: No focal liver abnormality is seen. No gallstones, gallbladder wall thickening, or biliary dilatation. Pancreas: Unremarkable. No pancreatic ductal dilatation or surrounding inflammatory changes. Spleen: Normal in size without focal abnormality. Adrenals/Urinary Tract: Normal adrenal glands. Mild right-sided hydronephrosis, similar compared to the recent prior imaging. Punctate stones distally at the UVJ.  No additional nephrolithiasis identified. Stomach/Bowel: Stomach is within normal limits. Appendix appears normal. No evidence of bowel wall thickening, distention, or inflammatory changes. Vascular/Lymphatic: No significant vascular findings are present. No enlarged abdominal or pelvic lymph nodes. Reproductive: Prostate is unremarkable. Other: No abdominal wall hernia or abnormality. No abdominopelvic ascites. Musculoskeletal: No acute or significant osseous findings. IMPRESSION: 1. Partially obstructing punctate stone at the right ureterovesicular junction resulting in mild right-sided hydro uretero nephrosis. Electronically Signed   By: Malachy Moan M.D.   On: 10/23/2020 09:45    Procedures Procedures   Medications Ordered in ED Medications  ketorolac (TORADOL) 30 MG/ML injection 30 mg (30 mg Intravenous Given 10/23/20 0855)  HYDROmorphone (DILAUDID) injection  1 mg (1 mg Intravenous Given 10/23/20 0924)  ondansetron (ZOFRAN) injection 4 mg (4 mg Intravenous Given 10/23/20 9892)    ED Course  I have reviewed the triage vital signs and the nursing notes.  Pertinent labs & imaging results that were available during my care of the patient were reviewed by me and considered in my medical decision making (see chart for details).    MDM Rules/Calculators/A&P                          Patient with small kidney stone.  He is given Percocet and Zofran and Flomax and referred to urology Final Clinical Impression(s) / ED Diagnoses Final diagnoses:  Flank pain    Rx / DC Orders ED Discharge Orders         Ordered    oxyCODONE-acetaminophen (PERCOCET) 5-325 MG tablet  Every 6 hours PRN,   Status:  Discontinued        10/23/20 1055    ondansetron (ZOFRAN ODT) 4 MG disintegrating tablet  Status:  Discontinued        10/23/20 1055    tamsulosin (FLOMAX) 0.4 MG CAPS capsule  Daily,   Status:  Discontinued        10/23/20 1055    ondansetron (ZOFRAN ODT) 4 MG disintegrating tablet         10/23/20 1057    oxyCODONE-acetaminophen (PERCOCET) 5-325 MG tablet  Every 6 hours PRN        10/23/20 1057    tamsulosin (FLOMAX) 0.4 MG CAPS capsule  Daily        10/23/20 1057           Bethann Berkshire, MD 10/24/20 1356

## 2021-02-12 ENCOUNTER — Encounter: Payer: Self-pay | Admitting: Emergency Medicine

## 2021-02-12 ENCOUNTER — Other Ambulatory Visit: Payer: Self-pay

## 2021-02-12 ENCOUNTER — Ambulatory Visit
Admission: EM | Admit: 2021-02-12 | Discharge: 2021-02-12 | Disposition: A | Discharge status: Home / Self Care | Payer: Medicaid Other | Attending: Emergency Medicine | Admitting: Emergency Medicine

## 2021-02-12 DIAGNOSIS — S0501XA Injury of conjunctiva and corneal abrasion without foreign body, right eye, initial encounter: Secondary | ICD-10-CM | POA: Diagnosis not present

## 2021-02-12 MED ORDER — POLYMYXIN B-TRIMETHOPRIM 10000-0.1 UNIT/ML-% OP SOLN: OPHTHALMIC | 0 refills | Status: DC

## 2021-02-12 NOTE — Discharge Instructions (Addendum)
Use polytrim eye drops as prescribed and to completion Use OTC systane or genteal gel eye drops at night as needed for symptomatic relief Use OTC ibuprofen or tylenol as needed for pain relief Return here or follow up with ophthamolgy if symptoms persists or worsen such as fever, chills, redness, swelling, eye pain, painful eye movements, vision changes, etc... 

## 2021-02-12 NOTE — ED Triage Notes (Signed)
Right eye irritated and red and watering.  States he thinks he may have something in his eye.  Symptoms since this morning.

## 2021-02-12 NOTE — ED Provider Notes (Signed)
[facility]   [id number] 02/12/21 Arrival Time: 1623  CC: Red eye  SUBJECTIVE:  Nathan Bird is a 20 y.o. male who presents with complaint of eye redness and irritation, watering and FB sensation that began abruptly this morning.  Denies a precipitating event, trauma, or close contacts with similar symptoms.  Has tried rinsing eye without relief.  Symptoms are made worse with light.  Reports similar symptoms in the past.  Denies fever, chills, nausea, vomiting, painful eye movements, halos, itching, vision changes, double vision, periorbital erythema.     Denies contact lens use.    ROS: As per HPI.  All other pertinent ROS negative.     Past Medical History:  Diagnosis Date   Allergic rhinitis 12/08/2012   Kidney stones    Obesity, Class III, BMI 40-49.9 (morbid obesity) (HCC)    Obesity, unspecified 12/08/2012   Past Surgical History:  Procedure Laterality Date   TONSILLECTOMY     No Known Allergies No current facility-administered medications on file prior to encounter.   Current Outpatient Medications on File Prior to Encounter  Medication Sig Dispense Refill   escitalopram (LEXAPRO) 20 MG tablet Take 1 tablet (20 mg total) by mouth daily. 30 tablet 2   ondansetron (ZOFRAN ODT) 4 MG disintegrating tablet 4mg  ODT q4 hours prn nausea/vomit 12 tablet 0   oxyCODONE-acetaminophen (PERCOCET) 5-325 MG tablet Take 1 tablet by mouth every 6 (six) hours as needed. 20 tablet 0   tamsulosin (FLOMAX) 0.4 MG CAPS capsule Take 1 capsule (0.4 mg total) by mouth daily. 5 capsule 0   Social History   Socioeconomic History   Marital status: Single    Spouse name: Not on file   Number of children: Not on file   Years of education: Not on file   Highest education level: Not on file  Occupational History   Not on file  Tobacco Use   Smoking status: Passive Smoke Exposure - Never Smoker   Smokeless tobacco: Never  Vaping Use   Vaping Use: Every day  Substance and  Sexual Activity   Alcohol use: Yes    Comment: occ   Drug use: Yes    Types: Marijuana   Sexual activity: Not on file  Other Topics Concern   Not on file  Social History Narrative   Lives with both parents   Social Determinants of Health   Financial Resource Strain: Not on file  Food Insecurity: Not on file  Transportation Needs: Not on file  Physical Activity: Not on file  Stress: Not on file  Social Connections: Not on file  Intimate Partner Violence: Not on file   Family History  Problem Relation Age of Onset   Diabetes Paternal Grandfather    Stroke Paternal Grandfather    Cancer Mother    Depression Mother    Depression Father    Hypertension Father    Diabetes Father        borderline   Diabetes Maternal Grandmother    Cancer Maternal Grandfather        renal   Kidney disease Maternal Grandfather     OBJECTIVE:   Vitals:   02/12/21 1645  BP: 132/75  Pulse: 86  Resp: 16  Temp: 98.3 F (36.8 C)  TempSrc: Oral  SpO2: 98%    General appearance: alert; no distress Eyes: RT mild conjunctival erythema. PERRL; EOMI without discomfort;  no obvious drainage; lid everted without obvious FB; fluorescein uptake in 12-1 o'clock position Neck: supple Lungs:  clear to auscultation bilaterally Heart: regular rate and rhythm Skin: warm and dry Psychological: alert and cooperative; normal mood and affect   ASSESSMENT & PLAN:  1. Abrasion of right cornea, initial encounter     Meds ordered this encounter  Medications   trimethoprim-polymyxin b (POLYTRIM) ophthalmic solution    Sig: instill 1 drop of polymyxin B sulfate and trimethoprim sulfate ophthalmic solution (polymyxin B 09735 units/trimethoprim 1 mg per mL) to affected eye(s) every 3 hours for 7 to 10 days    Dispense:  10 mL    Refill:  0    Order Specific Question:   Supervising Provider    Answer:   Eustace Moore [[id number]]    Use polytrim eye drops as prescribed and to completion Use OTC  systane or genteal gel eye drops at night as needed for symptomatic relief Use OTC ibuprofen or tylenol as needed for pain relief Return here or follow up with ophthamolgy if symptoms persists or worsen such as fever, chills, redness, swelling, eye pain, painful eye movements, vision changes, etc...  Reviewed expectations re: course of current medical issues. Questions answered. Outlined signs and symptoms indicating need for more acute intervention. Patient verbalized understanding. After Visit Summary given.    Rennis Harding, PA-C 02/12/21 1737

## 2021-07-30 ENCOUNTER — Ambulatory Visit
Admission: EM | Admit: 2021-07-30 | Discharge: 2021-07-30 | Disposition: A | Discharge status: Home / Self Care | Payer: Medicaid Other | Attending: Urgent Care | Admitting: Urgent Care

## 2021-07-30 ENCOUNTER — Other Ambulatory Visit: Payer: Self-pay

## 2021-07-30 ENCOUNTER — Ambulatory Visit (INDEPENDENT_AMBULATORY_CARE_PROVIDER_SITE_OTHER): Payer: Medicaid Other

## 2021-07-30 DIAGNOSIS — R0989 Other specified symptoms and signs involving the circulatory and respiratory systems: Secondary | ICD-10-CM | POA: Diagnosis not present

## 2021-07-30 DIAGNOSIS — R509 Fever, unspecified: Secondary | ICD-10-CM

## 2021-07-30 DIAGNOSIS — R0689 Other abnormalities of breathing: Secondary | ICD-10-CM

## 2021-07-30 DIAGNOSIS — Z1152 Encounter for screening for COVID-19: Secondary | ICD-10-CM

## 2021-07-30 DIAGNOSIS — R059 Cough, unspecified: Secondary | ICD-10-CM

## 2021-07-30 DIAGNOSIS — J209 Acute bronchitis, unspecified: Secondary | ICD-10-CM

## 2021-07-30 MED ORDER — CETIRIZINE HCL 10 MG PO TABS: 10.0000 mg | ORAL_TABLET | Freq: Every day | ORAL | 0 refills | Status: DC

## 2021-07-30 MED ORDER — PREDNISONE 50 MG PO TABS: 50.0000 mg | ORAL_TABLET | Freq: Every day | ORAL | 0 refills | Status: DC

## 2021-07-30 MED ORDER — PROMETHAZINE-DM 6.25-15 MG/5ML PO SYRP: 5.0000 mL | ORAL_SOLUTION | Freq: Every evening | ORAL | 0 refills | Status: DC | PRN

## 2021-07-30 MED ORDER — ACETAMINOPHEN 325 MG PO TABS
975.0000 mg | ORAL_TABLET | Freq: Once | ORAL | Status: AC
  Administered 2021-07-30: 975 mg via ORAL

## 2021-07-30 MED ORDER — BENZONATATE 100 MG PO CAPS: 100.0000 mg | ORAL_CAPSULE | Freq: Three times a day (TID) | ORAL | 0 refills | Status: DC | PRN

## 2021-07-30 NOTE — ED Provider Notes (Signed)
[facility]   MRN: [id number] DOB: [birth year]  Subjective:   Nathan Bird is a 21 y.o. male presenting for 2-3 day history of acute onset coughing, chest pain, shortness of breath, fevers, body aches.  Patient does vape, is trying to cut back and quit.  He also smokes marijuana multiple times weekly.  Has a history of allergic rhinitis.  No history of asthma.  No sick contacts to the best of his knowledge.  Has not had COVID or flu yet.  No current facility-administered medications for this encounter.  Current Outpatient Medications:    escitalopram (LEXAPRO) 20 MG tablet, Take 1 tablet (20 mg total) by mouth daily., Disp: 30 tablet, Rfl: 2   ondansetron (ZOFRAN ODT) 4 MG disintegrating tablet, 4mg  ODT q4 hours prn nausea/vomit, Disp: 12 tablet, Rfl: 0   oxyCODONE-acetaminophen (PERCOCET) 5-325 MG tablet, Take 1 tablet by mouth every 6 (six) hours as needed., Disp: 20 tablet, Rfl: 0   tamsulosin (FLOMAX) 0.4 MG CAPS capsule, Take 1 capsule (0.4 mg total) by mouth daily., Disp: 5 capsule, Rfl: 0   trimethoprim-polymyxin b (POLYTRIM) ophthalmic solution, instill 1 drop of polymyxin B sulfate and trimethoprim sulfate ophthalmic solution (polymyxin B units/trimethoprim 1 mg per mL) to affected eye(s) every 3 hours for 7 to 10 days, Disp: 10 mL, Rfl: 0   No Known Allergies  Past Medical History:  Diagnosis Date   Allergic rhinitis 12/08/2012   Kidney stones    Obesity, Class III, BMI 40-49.9 (morbid obesity) (HCC)    Obesity, unspecified 12/08/2012     Past Surgical History:  Procedure Laterality Date   TONSILLECTOMY      Family History  Problem Relation Age of Onset   Diabetes Paternal Grandfather    Stroke Paternal Grandfather    Cancer Mother    Depression Mother    Depression Father    Hypertension Father    Diabetes Father        borderline   Diabetes Maternal Grandmother    Cancer Maternal Grandfather        renal   Kidney disease  Maternal Grandfather     Social History   Tobacco Use   Smoking status: Passive Smoke Exposure - Never Smoker   Smokeless tobacco: Never  Vaping Use   Vaping Use: Every day  Substance Use Topics   Alcohol use: Yes    Comment: occ   Drug use: Yes    Types: Marijuana    ROS   Objective:   Vitals: BP 124/77    Pulse 91    Temp (!) 102 F (38.9 C)    Resp 20    SpO2 96%   Physical Exam Constitutional:      General: He is not in acute distress.    Appearance: Normal appearance. He is well-developed. He is obese. He is not ill-appearing, toxic-appearing or diaphoretic.  HENT:     Head: Normocephalic and atraumatic.     Right Ear: External ear normal.     Left Ear: External ear normal.     Nose: Nose normal.     Mouth/Throat:     Mouth: Mucous membranes are moist.  Eyes:     General: No scleral icterus.       Right eye: No discharge.        Left eye: No discharge.     Extraocular Movements: Extraocular movements intact.  Cardiovascular:     Rate and Rhythm: Normal rate and regular rhythm.  Heart sounds: Normal heart sounds. No murmur heard.   No friction rub. No gallop.  Pulmonary:     Effort: Pulmonary effort is normal.     Comments: Decreased lung sounds throughout and may be a result of body habitus. Neurological:     Mental Status: He is alert and oriented to person, place, and time.  Psychiatric:        Mood and Affect: Mood normal.        Behavior: Behavior normal.        Thought Content: Thought content normal.   DG Chest 2 View  Result Date: 07/30/2021 CLINICAL DATA:  Cough, fever and body aches.  Abnormal lung sounds. EXAM: CHEST - 2 VIEW COMPARISON:  05/30/2014 FINDINGS: Heart size is normal. Mediastinal shadows are normal. There is central bronchial thickening but no infiltrate, collapse or effusion. Minimal scoliotic curvature of the spine, not visibly progressive since 2015. IMPRESSION: Bronchitis pattern.  No consolidation or collapse.  Electronically Signed   By: Paulina Fusi M.D.   On: 07/30/2021 16:50     Assessment and Plan :   PDMP not reviewed this encounter.  1. Acute bronchitis, unspecified organism   2. Encounter for screening for COVID-19   3. Decreased lung sounds   4. Fever, unspecified    Recommended an oral prednisone course to help with patient's respiratory symptoms, bronchitis.  Deferred antibiotics as it is largely viral in etiology.  Labs are pending.  Recommended supportive care otherwise. Counseled patient on potential for adverse effects with medications prescribed/recommended today, ER and return-to-clinic precautions discussed, patient verbalized understanding.    Wallis Bamberg, [place] 07/30/21 1656

## 2021-07-30 NOTE — ED Triage Notes (Signed)
Pt presents with c/o cough, fever and body aches since Tuesday

## 2021-07-31 LAB — COVID-19, FLU A+B NAA
Influenza A, NAA: NOT DETECTED
Influenza B, NAA: NOT DETECTED
SARS-CoV-2, NAA: NOT DETECTED

## 2021-09-26 IMAGING — CT CT RENAL STONE PROTOCOL
2 of 4 series · 16 of 46 positions shown, 18 images · non-contrast
Comparison: Recent prior renal stone study 09/18/2020

CLINICAL DATA: Flank pain, suspect kidney stone

EXAM:
CT ABDOMEN AND PELVIS WITHOUT CONTRAST
TECHNIQUE: Multidetector CT imaging of the abdomen and pelvis was performed
following the standard protocol without IV contrast.

[Series 2: axial st · axial · 0.82mm/px · z∈[+810,+1295]mm · 13 of 113 slices shown, 15 images]
[im 8/113  soft-tissue]
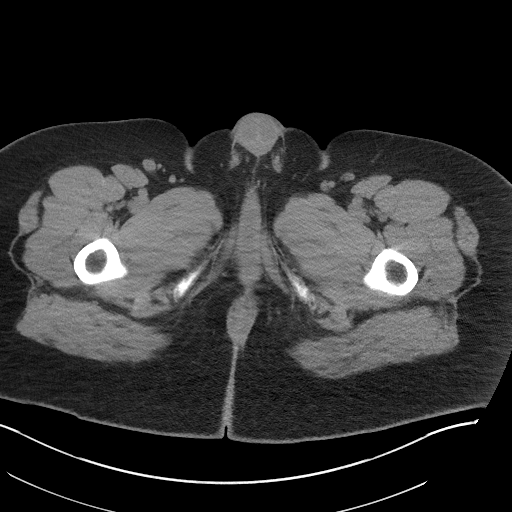
[im 8/113  bone]
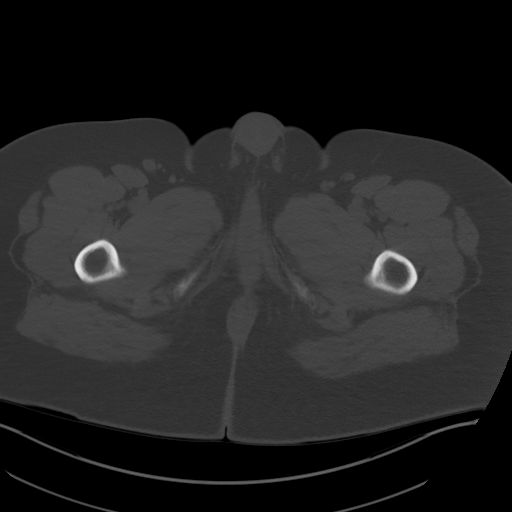
[im 15/113  soft-tissue]
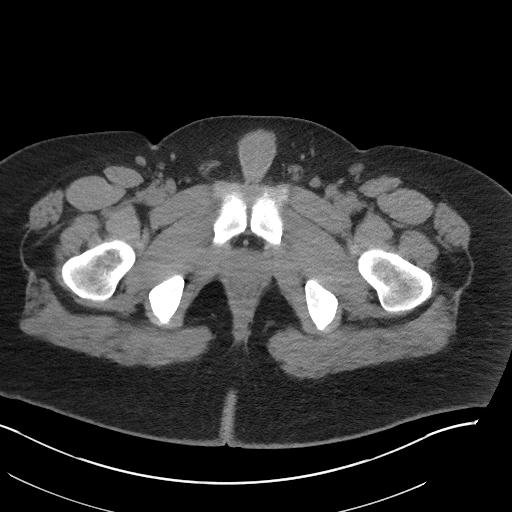
[im 23/113  soft-tissue]
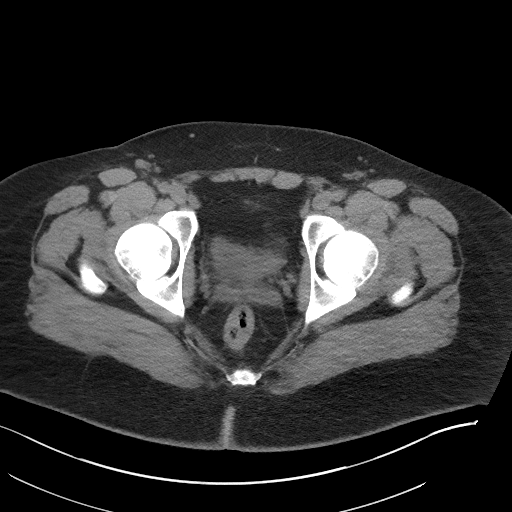
[im 30/113  soft-tissue]
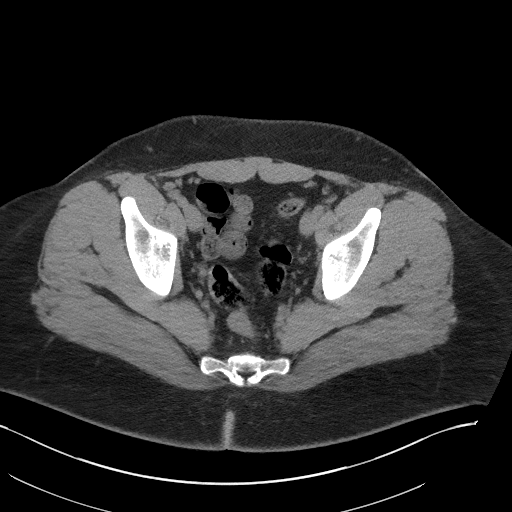
[im 38/113  soft-tissue]
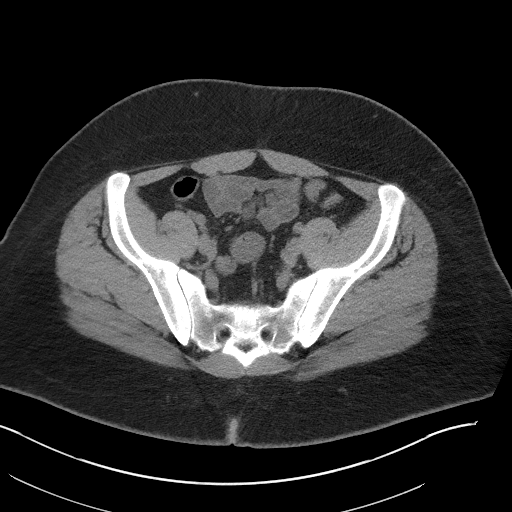
[im 45/113  soft-tissue]
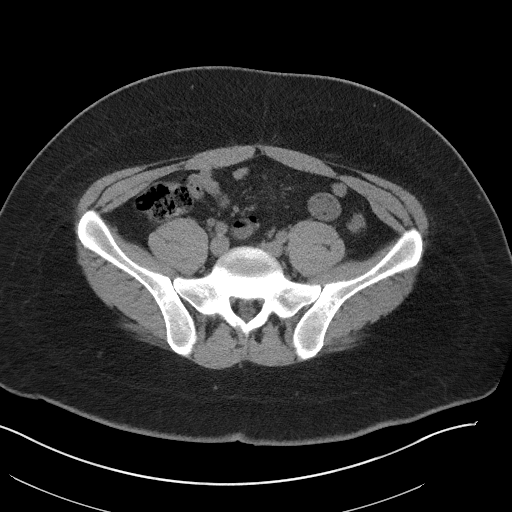
[im 60/113  soft-tissue]
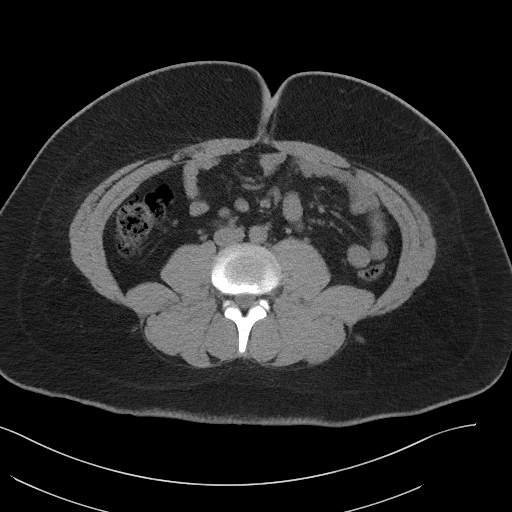
[im 68/113  soft-tissue]
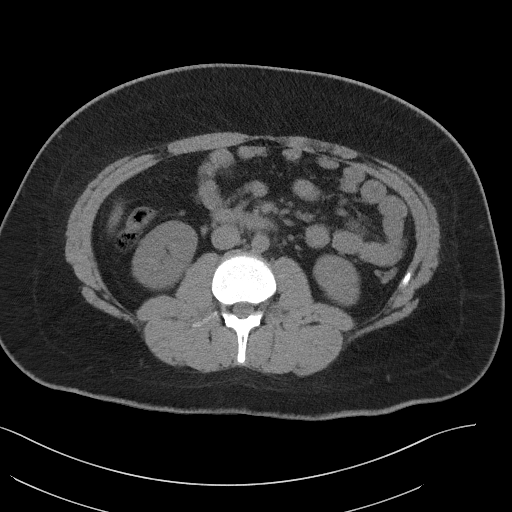
[im 75/113  soft-tissue]
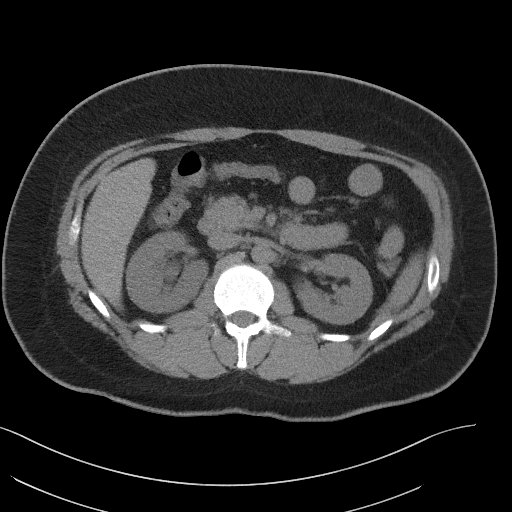
[im 75/113  bone]
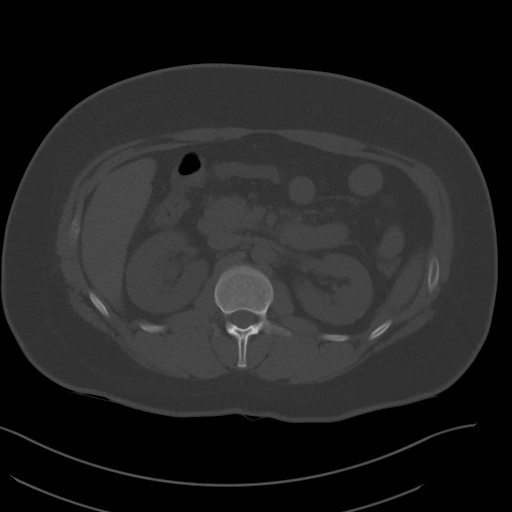
[im 83/113  soft-tissue]
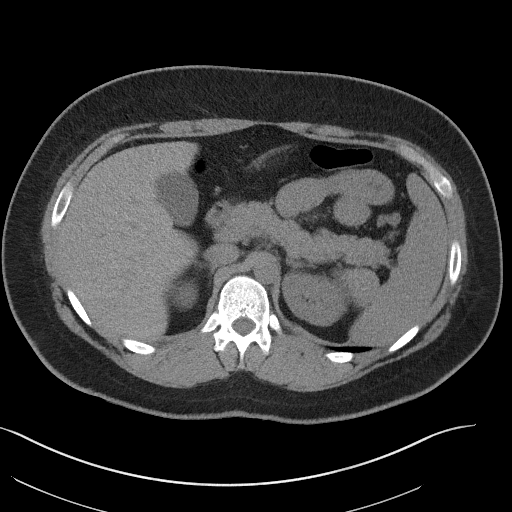
[im 90/113  soft-tissue]
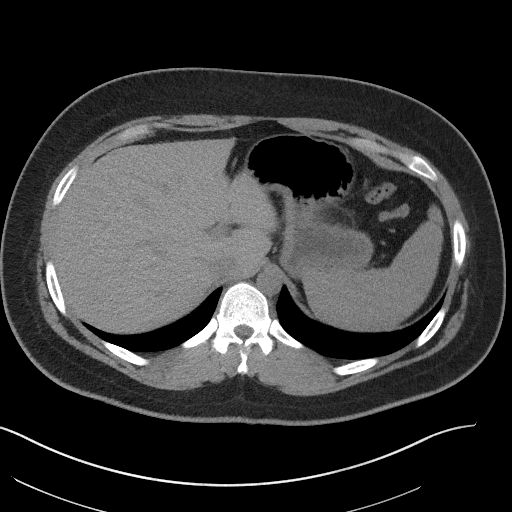
[im 98/113  soft-tissue]
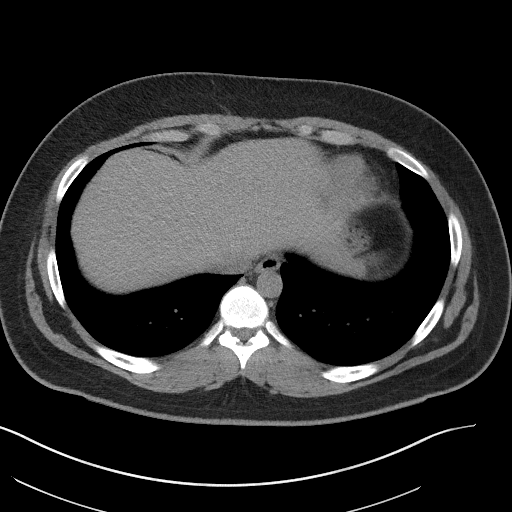
[im 105/113  soft-tissue]
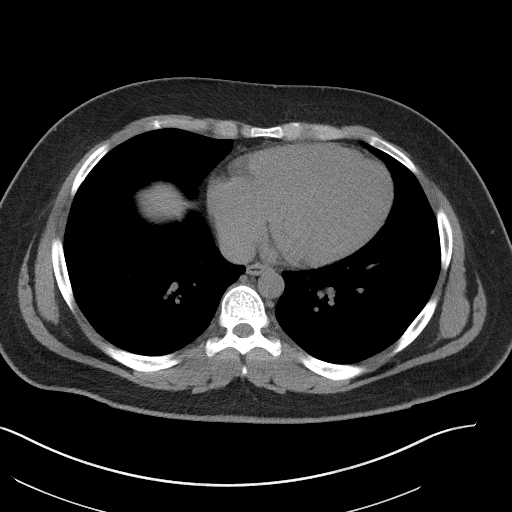

[Series 5: coronal st · coronal · 0.94mm/px · 3 of 101 slices shown]
[im 34/101  soft-tissue]
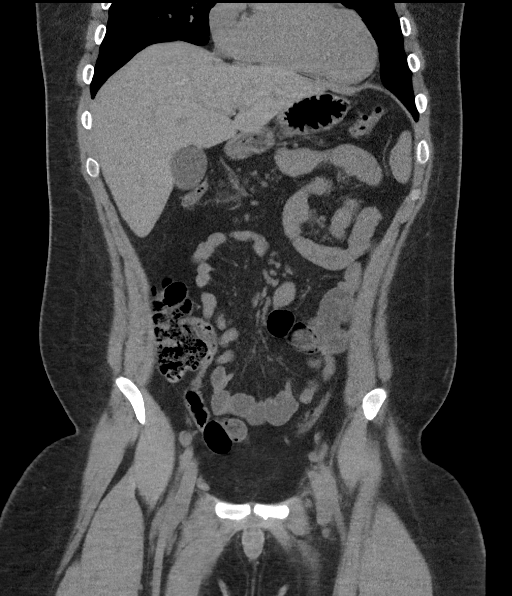
[im 45/101  soft-tissue]
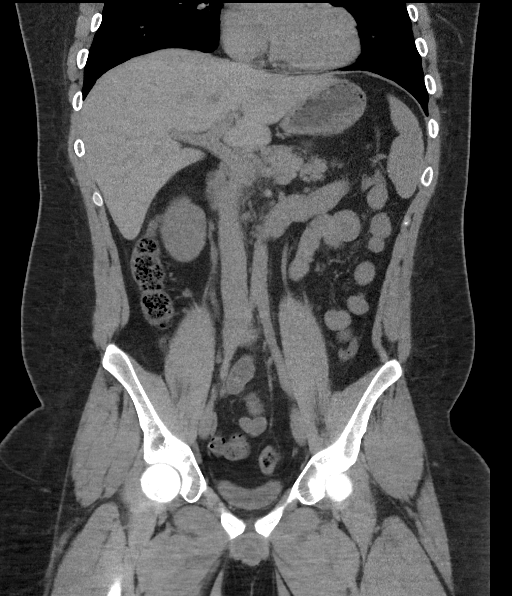
[im 56/101  soft-tissue]
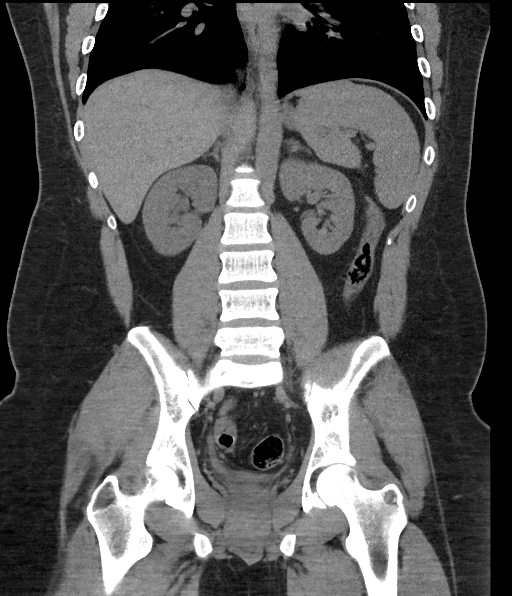

[16 of 46 positions shown; findings below may reference images not displayed]

FINDINGS: Lower chest: No acute abnormality.

Hepatobiliary: No focal liver abnormality is seen. No gallstones,
gallbladder wall thickening, or biliary dilatation.

Pancreas: Unremarkable. No pancreatic ductal dilatation or
surrounding inflammatory changes.

Spleen: Normal in size without focal abnormality.

Adrenals/Urinary Tract: Normal adrenal glands. Mild right-sided
hydronephrosis, similar compared to the recent prior imaging.
Punctate stones distally at the UVJ. No additional nephrolithiasis
identified.

Stomach/Bowel: Stomach is within normal limits. Appendix appears
normal. No evidence of bowel wall thickening, distention, or
inflammatory changes.

Vascular/Lymphatic: No significant vascular findings are present. No
enlarged abdominal or pelvic lymph nodes.

Reproductive: Prostate is unremarkable.

Other: No abdominal wall hernia or abnormality. No abdominopelvic
ascites.

Musculoskeletal: No acute or significant osseous findings.
IMPRESSION: 1. Partially obstructing punctate stone at the right
ureterovesicular junction resulting in mild right-sided hydro
uretero nephrosis.

## 2021-10-21 ENCOUNTER — Other Ambulatory Visit: Payer: Self-pay

## 2021-10-21 ENCOUNTER — Ambulatory Visit
Admission: RE | Admit: 2021-10-21 | Discharge: 2021-10-21 | Disposition: A | Discharge status: Home / Self Care | Payer: Medicaid Other | Source: Ambulatory Visit | Attending: Urgent Care | Admitting: Urgent Care

## 2021-10-21 VITALS — BP 131/78 | HR 60 | Temp 98.3°F | Resp 18 | Ht 70.0 in | Wt 260.0 lb

## 2021-10-21 DIAGNOSIS — H6123 Impacted cerumen, bilateral: Secondary | ICD-10-CM | POA: Diagnosis not present

## 2021-10-21 MED ORDER — CARBAMIDE PEROXIDE 6.5 % OT SOLN: 5.0000 [drp] | Freq: Two times a day (BID) | OTIC | 0 refills | Status: DC

## 2021-10-21 NOTE — ED Provider Notes (Signed)
?[facility] ? ? ?MRN: [id number] DOB: [birth year] ? ?Subjective:  ? ?Nathan Bird is a 21 y.o. male presenting for 1.5-week history of persistent right ear fullness.  Patient has tried to get earwax out using an ear candle.  Has gotten a lot of wax to come out of the right but still feels like he cannot hear out of it very well.  Feels a lot of pressure. ? ?No current facility-administered medications for this encounter. ? ?Current Outpatient Medications:  ?  benzonatate (TESSALON) 100 MG capsule, Take 1-2 capsules (100-200 mg total) by mouth 3 (three) times daily as needed for cough., Disp: 60 capsule, Rfl: 0 ?  cetirizine (ZYRTEC ALLERGY) 10 MG tablet, Take 1 tablet (10 mg total) by mouth daily., Disp: 30 tablet, Rfl: 0 ?  escitalopram (LEXAPRO) 20 MG tablet, Take 1 tablet (20 mg total) by mouth daily., Disp: 30 tablet, Rfl: 2 ?  ondansetron (ZOFRAN ODT) 4 MG disintegrating tablet, 4mg  ODT q4 hours prn nausea/vomit, Disp: 12 tablet, Rfl: 0 ?  oxyCODONE-acetaminophen (PERCOCET) 5-325 MG tablet, Take 1 tablet by mouth every 6 (six) hours as needed., Disp: 20 tablet, Rfl: 0 ?  predniSONE (DELTASONE) 50 MG tablet, Take 1 tablet (50 mg total) by mouth daily with breakfast., Disp: 5 tablet, Rfl: 0 ?  promethazine-dextromethorphan (PROMETHAZINE-DM) 6.25-15 MG/5ML syrup, Take 5 mLs by mouth at bedtime as needed for cough., Disp: 100 mL, Rfl: 0 ?  tamsulosin (FLOMAX) 0.4 MG CAPS capsule, Take 1 capsule (0.4 mg total) by mouth daily., Disp: 5 capsule, Rfl: 0 ?  trimethoprim-polymyxin b (POLYTRIM) ophthalmic solution, instill 1 drop of polymyxin B sulfate and trimethoprim sulfate ophthalmic solution (polymyxin B 09-20-1990 units/trimethoprim 1 mg per mL) to affected eye(s) every 3 hours for 7 to 10 days, Disp: 10 mL, Rfl: 0  ? ?No Known Allergies ? ?Past Medical History:  ?Diagnosis Date  ? Allergic rhinitis 12/08/2012  ? Kidney stones   ? Obesity, Class III, BMI 40-49.9 (morbid obesity) (HCC)   ? Obesity,  unspecified 12/08/2012  ?  ? ?Past Surgical History:  ?Procedure Laterality Date  ? TONSILLECTOMY    ? ? ?Family History  ?Problem Relation Age of Onset  ? Diabetes Paternal Grandfather   ? Stroke Paternal Grandfather   ? Cancer Mother   ? Depression Mother   ? Depression Father   ? Hypertension Father   ? Diabetes Father   ?     borderline  ? Diabetes Maternal Grandmother   ? Cancer Maternal Grandfather   ?     renal  ? Kidney disease Maternal Grandfather   ? ? ?Social History  ? ?Tobacco Use  ? Smoking status: Passive Smoke Exposure - Never Smoker  ? Smokeless tobacco: Never  ?Vaping Use  ? Vaping Use: Every day  ?Substance Use Topics  ? Alcohol use: Yes  ?  Comment: occ  ? Drug use: Yes  ?  Types: Marijuana  ? ? ?ROS ? ? ?Objective:  ? ?Vitals: ?BP 131/78 (BP Location: Right Arm)   Pulse 60   Temp 98.3 ?F (36.8 ?C) (Oral)   Resp 18   Ht 5\' 10"  (1.778 m)   Wt 260 lb (117.9 kg)   SpO2 98%   BMI 37.31 kg/m?  ? ?Physical Exam ?Constitutional:   ?   General: He is not in acute distress. ?   Appearance: Normal appearance. He is well-developed and normal weight. He is not ill-appearing, toxic-appearing or diaphoretic.  ?HENT:  ?  Head: Normocephalic and atraumatic.  ?   Right Ear: Tympanic membrane, ear canal and external ear normal. There is impacted cerumen.  ?   Left Ear: Tympanic membrane, ear canal and external ear normal. There is impacted cerumen.  ?   Nose: Nose normal.  ?   Mouth/Throat:  ?   Pharynx: Oropharynx is clear.  ?Eyes:  ?   General: No scleral icterus.    ?   Right eye: No discharge.     ?   Left eye: No discharge.  ?   Extraocular Movements: Extraocular movements intact.  ?Cardiovascular:  ?   Rate and Rhythm: Normal rate.  ?Pulmonary:  ?   Effort: Pulmonary effort is normal.  ?Musculoskeletal:  ?   Cervical back: Normal range of motion.  ?Neurological:  ?   Mental Status: He is alert and oriented to person, place, and time.  ?Psychiatric:     ?   Mood and Affect: Mood normal.     ?    Behavior: Behavior normal.     ?   Thought Content: Thought content normal.     ?   Judgment: Judgment normal.  ? ?Ear lavage performed using mixture of peroxide and water.  Pressure irrigation performed using a bottle and a thin ear tube.  Bilateral ear lavage.  Curette was used for manual removal. ? ? ?Assessment and Plan :  ? ?PDMP not reviewed this encounter. ? ?1. Bilateral impacted cerumen   ? ?Successful bilateral ear lavage.  General management of cerumen impaction reviewed with patient.  Anticipatory guidance provided. Counseled patient on potential for adverse effects with medications prescribed/recommended today, ER and return-to-clinic precautions discussed, patient verbalized understanding. ? ?  ?Wallis Bamberg, PA-C ?10/21/21 1011 ? ?

## 2021-10-21 NOTE — ED Triage Notes (Signed)
Pt reports right ear fullness x1.5 weeks. Pt reports used "ear candle" last night and reports had "large amount of waxt ocome out but feeling of fullness remains." Denies pain but reports pressure. ?

## 2022-03-04 NOTE — Progress Notes (Signed)
New Patient Office Visit  Subjective    Patient ID: Nathan Bird, male    DOB: September 05, 2000  Age: 21 y.o. MRN: [id number]  CC:  Chief Complaint  Patient presents with   Establish Care    Lack of hearing in right ear, last month noticed increase pain in right wrist (aching, burning)    HPI Nathan Bird presents to establish care.  He is a 21 year old male with a past medical history significant for morbid obesity.  He was previously followed by Dr. Karilyn Cota of pediatrics. Last seen March 2022.   Today Nathan Bird is doing well.  His current symptoms include periodic right wrist pain and fullness in his right ear.  He is accompanied by his mother today he denies acute concerns currently.  Nathan Bird is not currently taking any medications.  He is working in an [employer]ing [employer].  Acute concern of chronic medical issues discussed today are individually dressed in A/P below.  Outpatient Encounter Medications as of 03/05/2022  Medication Sig   [DISCONTINUED] benzonatate (TESSALON) 100 MG capsule Take 1-2 capsules (100-200 mg total) by mouth 3 (three) times daily as needed for cough. (Patient not taking: Reported on 03/05/2022)   [DISCONTINUED] carbamide peroxide (DEBROX) 6.5 % OTIC solution Place 5 drops into both ears 2 (two) times daily. (Patient not taking: Reported on 03/05/2022)   [DISCONTINUED] cetirizine (ZYRTEC ALLERGY) 10 MG tablet Take 1 tablet (10 mg total) by mouth daily. (Patient not taking: Reported on 03/05/2022)   [DISCONTINUED] escitalopram (LEXAPRO) 20 MG tablet Take 1 tablet (20 mg total) by mouth daily. (Patient not taking: Reported on 03/05/2022)   [DISCONTINUED] ondansetron (ZOFRAN ODT) 4 MG disintegrating tablet 4mg  ODT q4 hours prn nausea/vomit (Patient not taking: Reported on 03/05/2022)   [DISCONTINUED] oxyCODONE-acetaminophen (PERCOCET) 5-325 MG tablet Take 1 tablet by mouth every 6 (six) hours as needed. (Patient not taking: Reported on 03/05/2022)   [DISCONTINUED] predniSONE  (DELTASONE) 50 MG tablet Take 1 tablet (50 mg total) by mouth daily with breakfast. (Patient not taking: Reported on 03/05/2022)   [DISCONTINUED] promethazine-dextromethorphan (PROMETHAZINE-DM) 6.25-15 MG/5ML syrup Take 5 mLs by mouth at bedtime as needed for cough. (Patient not taking: Reported on 03/05/2022)   [DISCONTINUED] tamsulosin (FLOMAX) 0.4 MG CAPS capsule Take 1 capsule (0.4 mg total) by mouth daily. (Patient not taking: Reported on 03/05/2022)   [DISCONTINUED] trimethoprim-polymyxin b (POLYTRIM) ophthalmic solution instill 1 drop of polymyxin B sulfate and trimethoprim sulfate ophthalmic solution (polymyxin B 03/07/2022 units/trimethoprim 1 mg per mL) to affected eye(s) every 3 hours for 7 to 10 days (Patient not taking: Reported on 03/05/2022)   No facility-administered encounter medications on file as of 03/05/2022.    Past Medical History:  Diagnosis Date   Allergic rhinitis 12/08/2012   Kidney stones    Obesity, Class III, BMI 40-49.9 (morbid obesity) (HCC)    Obesity, unspecified 12/08/2012    Past Surgical History:  Procedure Laterality Date   TONSILLECTOMY      Family History  Problem Relation Age of Onset   Diabetes Paternal Grandfather    Stroke Paternal Grandfather    Cancer Mother    Depression Mother    Depression Father    Hypertension Father    Diabetes Father        borderline   Diabetes Maternal Grandmother    Cancer Maternal Grandfather        renal   Kidney disease Maternal Grandfather     Social History   Socioeconomic History   Marital status:  Single    Spouse name: Not on file   Number of children: Not on file   Years of education: Not on file   Highest education level: Not on file  Occupational History   Not on file  Tobacco Use   Smoking status: Never    Passive exposure: Yes   Smokeless tobacco: Never  Vaping Use   Vaping Use: Every day  Substance and Sexual Activity   Alcohol use: Yes    Comment: occ   Drug use: Yes    Types: Marijuana    Sexual activity: Not on file  Other Topics Concern   Not on file  Social History Narrative   Lives with both parents   Social Determinants of Health   Financial Resource Strain: Not on file  Food Insecurity: Not on file  Transportation Needs: Not on file  Physical Activity: Not on file  Stress: Not on file  Social Connections: Not on file  Intimate Partner Violence: Not on file   Review of Systems  Constitutional:  Negative for chills and fever.  HENT:  Negative for sore throat.        R ear fullness  Respiratory:  Negative for cough and shortness of breath.   Cardiovascular:  Negative for chest pain, palpitations and leg swelling.  Gastrointestinal:  Negative for abdominal pain, blood in stool, constipation, diarrhea, nausea and vomiting.  Genitourinary:  Negative for dysuria and hematuria.  Musculoskeletal:  Negative for myalgias.       R wrist pain  Skin:  Negative for itching and rash.  Neurological:  Negative for dizziness and headaches.  Psychiatric/Behavioral:  Negative for depression and suicidal ideas.    Objective    BP 133/73   Pulse (!) 58   Ht 5\' 10"  (1.778 m)   Wt 269 lb 6.4 oz (122.2 kg)   SpO2 95%   BMI 38.65 kg/m   Physical Exam Vitals reviewed.  Constitutional:      General: He is not in acute distress.    Appearance: Normal appearance. He is obese. He is not ill-appearing.  HENT:     Head: Normocephalic and atraumatic.     Right Ear: There is impacted cerumen.     Left Ear: Tympanic membrane normal. There is no impacted cerumen.     Nose: Nose normal. No congestion or rhinorrhea.     Mouth/Throat:     Mouth: Mucous membranes are moist.     Pharynx: Oropharynx is clear.  Eyes:     Extraocular Movements: Extraocular movements intact.     Conjunctiva/sclera: Conjunctivae normal.     Pupils: Pupils are equal, round, and reactive to light.  Cardiovascular:     Rate and Rhythm: Normal rate and regular rhythm.     Pulses: Normal pulses.      Heart sounds: Normal heart sounds. No murmur heard. Pulmonary:     Effort: Pulmonary effort is normal.     Breath sounds: Normal breath sounds. No wheezing, rhonchi or rales.  Abdominal:     General: Abdomen is flat. Bowel sounds are normal. There is no distension.     Palpations: Abdomen is soft.     Tenderness: There is no abdominal tenderness.  Musculoskeletal:        General: No swelling or deformity. Normal range of motion.     Cervical back: Normal range of motion.     Comments: ROM intact at R wrist. No obvious swelling or deformity. Non-tender to palpation.   Skin:  General: Skin is warm and dry.     Capillary Refill: Capillary refill takes less than 2 seconds.  Neurological:     General: No focal deficit present.     Mental Status: He is alert and oriented to person, place, and time.     Motor: No weakness.  Psychiatric:        Mood and Affect: Mood normal.        Behavior: Behavior normal.        Thought Content: Thought content normal.     Last CBC Lab Results  Component Value Date   WBC 6.6 10/23/2020   HGB 15.1 10/23/2020   HCT 45.3 10/23/2020   MCV 93.0 10/23/2020   MCH 31.0 10/23/2020   RDW 12.7 10/23/2020   PLT 257 10/23/2020   Last metabolic panel Lab Results  Component Value Date   GLUCOSE 117 (H) 10/23/2020   NA 138 10/23/2020   K 4.0 10/23/2020   CL 104 10/23/2020   CO2 23 10/23/2020   BUN 14 10/23/2020   CREATININE 0.81 10/23/2020   GFRNONAA >60 10/23/2020   CALCIUM 9.1 10/23/2020   PROT 7.7 10/23/2020   ALBUMIN 4.7 10/23/2020   BILITOT 0.6 10/23/2020   ALKPHOS 65 10/23/2020   AST 19 10/23/2020   ALT 15 10/23/2020   ANIONGAP 11 10/23/2020   Assessment & Plan:   Problem List Items Addressed This Visit       Nervous and Auditory   Impacted cerumen of right ear - Primary    Unable to visualize TM of right ear due to impacted cerumen.  This is a recurrent issue.  He has tried home remedies without sustained success. -Right ear lavage  successfully performed today -We discussed that use of otic drops such as Debrox would be appropriate if he began to experience right ear fullness in the future        Other   Class 3 severe obesity due to excess calories with body mass index (BMI) of 45.0 to 49.9 in adult (HCC)    BMI 38 today.  He is aware of the need to lose weight.  We discussed that while it may not be impacting his life currently, obesity can have a negative impact on his health in the years to come.  We reviewed appropriate diet and exercise recommendations.  He expresses understanding and states that he will make an effort to lose weight.  I have also provided him with information regarding the Mediterranean diet.      Right wrist pain    He describes occasional pain along both the dorsal and volar surfaces of the right wrist with extension.  This is after repetitive extension maneuvers associated with his job.  Denies pain currently.  His exam today is benign.  We discussed conservative measures should his pain recur such as bracing, heating/icing, and anti-inflammatory use.  If pain persists or worsens, I have instructed him to return to care at which time we can consider obtaining x-rays.      Depression with anxiety    Previously managed with Lexapro, but he has been off of this medication for several months.  He states that his mood is stable and currently denies SI/HI.  He attributes prior symptoms of depression to switching jobs. -No indication to resume antidepressant therapy currently      Healthcare maintenance    -He is agreeable to receiving his flu shot today -Declined additional outstanding vaccinations including COVID-19 and HPV -Declined HCV/HIV screening  Return in about 6 months (around 09/05/2022).   Billie Lade, MD

## 2022-03-05 ENCOUNTER — Ambulatory Visit (INDEPENDENT_AMBULATORY_CARE_PROVIDER_SITE_OTHER): Payer: Medicaid Other | Admitting: Internal Medicine

## 2022-03-05 ENCOUNTER — Encounter: Payer: Self-pay | Admitting: Internal Medicine

## 2022-03-05 VITALS — BP 133/73 | HR 58 | Ht 70.0 in | Wt 269.4 lb

## 2022-03-05 DIAGNOSIS — Z23 Encounter for immunization: Secondary | ICD-10-CM

## 2022-03-05 DIAGNOSIS — Z6841 Body Mass Index (BMI) 40.0 and over, adult: Secondary | ICD-10-CM

## 2022-03-05 DIAGNOSIS — H6121 Impacted cerumen, right ear: Secondary | ICD-10-CM

## 2022-03-05 DIAGNOSIS — Z0000 Encounter for general adult medical examination without abnormal findings: Secondary | ICD-10-CM | POA: Diagnosis not present

## 2022-03-05 DIAGNOSIS — F418 Other specified anxiety disorders: Secondary | ICD-10-CM

## 2022-03-05 DIAGNOSIS — M25531 Pain in right wrist: Secondary | ICD-10-CM | POA: Insufficient documentation

## 2022-03-05 DIAGNOSIS — E66813 Obesity, class 3: Secondary | ICD-10-CM

## 2022-03-05 NOTE — Assessment & Plan Note (Signed)
BMI 38 today.  He is aware of the need to lose weight.  We discussed that while it may not be impacting his life currently, obesity can have a negative impact on his health in the years to come.  We reviewed appropriate diet and exercise recommendations.  He expresses understanding and states that he will make an effort to lose weight.  I have also provided him with information regarding the Mediterranean diet.

## 2022-03-05 NOTE — Assessment & Plan Note (Signed)
Unable to visualize TM of right ear due to impacted cerumen.  This is a recurrent issue.  He has tried home remedies without sustained success. -Right ear lavage successfully performed today -We discussed that use of otic drops such as Debrox would be appropriate if he began to experience right ear fullness in the future

## 2022-03-05 NOTE — Patient Instructions (Addendum)
It was a pleasure to see you today.  Thank you for giving [place] the opportunity to be involved in your care.  Below is a brief recap of your visit and next steps.  We will plan to see you again in 6 months.  Summary Right ear flushed today Flu shot administered  Next steps I recommend dieting and exercise practices as we discussed. Please find the attached information regarding the Mediterranean diet Try using the Debrox drops regularly if you begin to experience ear fullness / discomfort again I recommend trying a wrist brace, icing / heating, and anti-inflammatory use if your wrist starts to hurt while working.

## 2022-03-05 NOTE — Assessment & Plan Note (Signed)
He describes occasional pain along both the dorsal and volar surfaces of the right wrist with extension.  This is after repetitive extension maneuvers associated with his job.  Denies pain currently.  His exam today is benign.  We discussed conservative measures should his pain recur such as bracing, heating/icing, and anti-inflammatory use.  If pain persists or worsens, I have instructed him to return to care at which time we can consider obtaining x-rays.

## 2022-03-05 NOTE — Assessment & Plan Note (Signed)
-  He is agreeable to receiving his flu shot today -Declined additional outstanding vaccinations including COVID-19 and HPV -Declined HCV/HIV screening

## 2022-03-05 NOTE — Assessment & Plan Note (Addendum)
Previously managed with Lexapro, but he has been off of this medication for several months.  He states that his mood is stable and currently denies SI/HI.  He attributes prior symptoms of depression to switching jobs. -No indication to resume antidepressant therapy currently

## 2022-07-03 IMAGING — DX DG CHEST 2V
2 series · 2 of 2 positions shown · non-contrast
Comparison: 05/30/2014

CLINICAL DATA: Cough, fever and body aches.  Abnormal lung sounds.

EXAM:
CHEST - 2 VIEW

[chest pa]
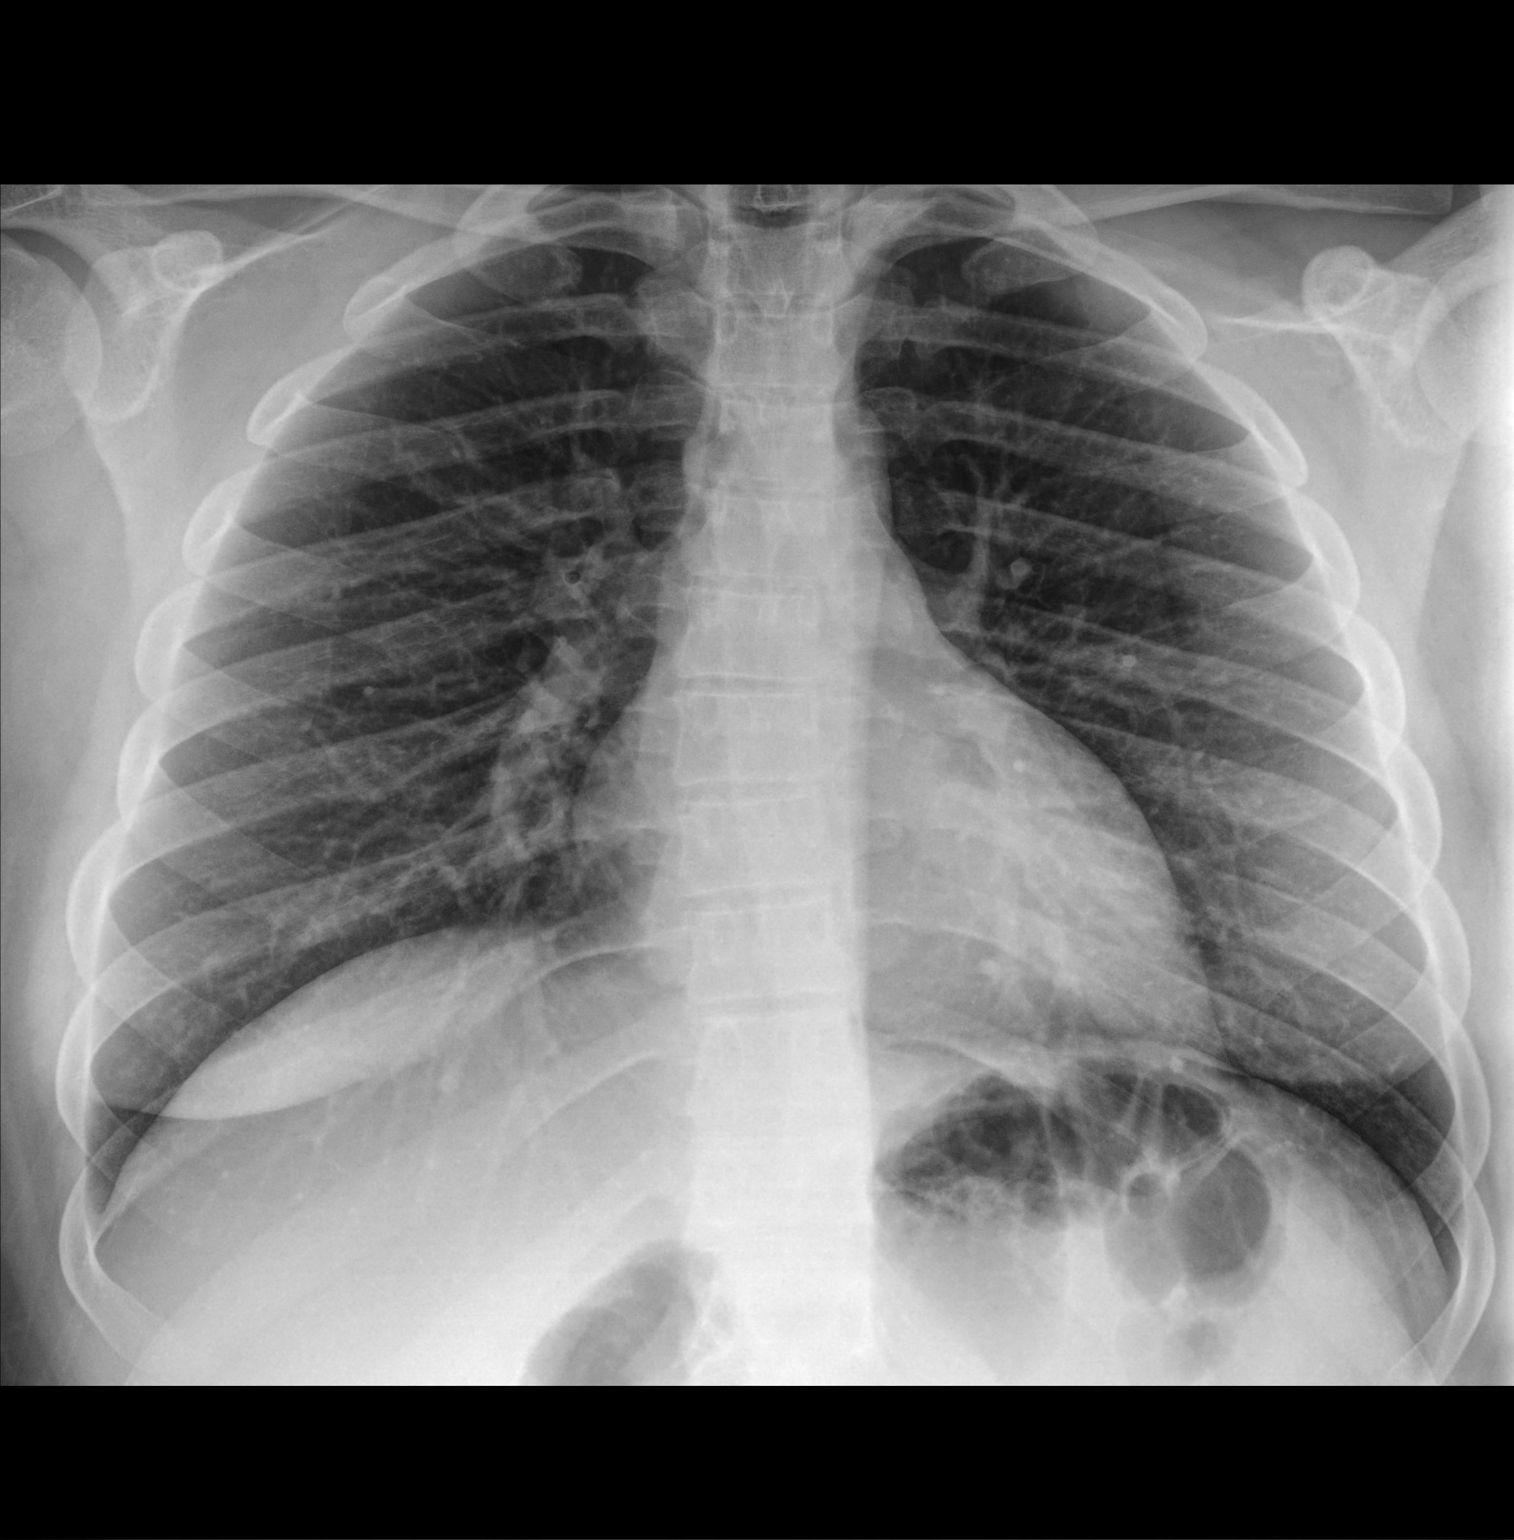

[chest lat]
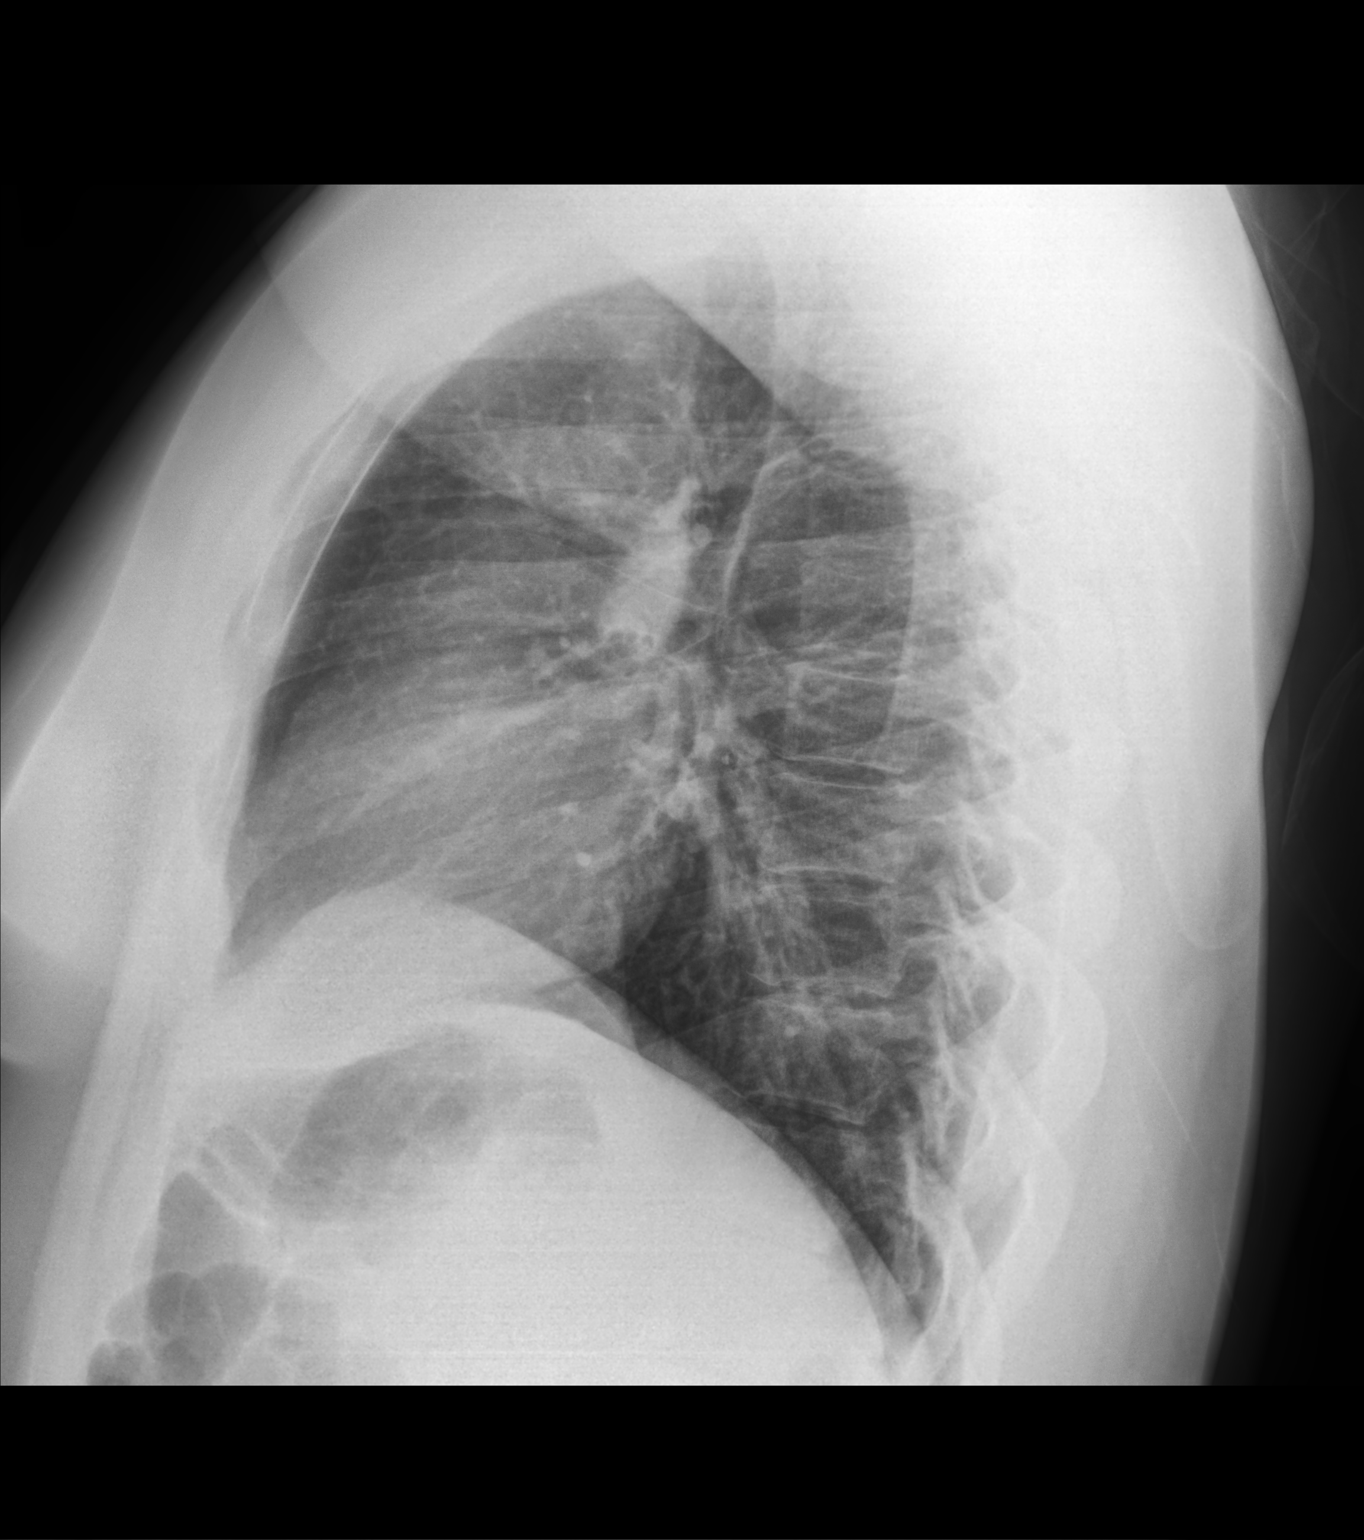

[2 of 2 positions shown; findings below may reference images not displayed]

FINDINGS: Heart size is normal. Mediastinal shadows are normal. There is
central bronchial thickening but no infiltrate, collapse or
effusion. Minimal scoliotic curvature of the spine, not visibly
progressive since 3913.
IMPRESSION: Bronchitis pattern.  No consolidation or collapse.

## 2022-07-20 ENCOUNTER — Encounter: Payer: Self-pay | Admitting: Family Medicine

## 2022-07-20 ENCOUNTER — Ambulatory Visit (INDEPENDENT_AMBULATORY_CARE_PROVIDER_SITE_OTHER): Payer: Medicaid Other | Admitting: Family Medicine

## 2022-07-20 VITALS — BP 115/70 | HR 65 | Ht 70.0 in | Wt 261.0 lb

## 2022-07-20 DIAGNOSIS — B9689 Other specified bacterial agents as the cause of diseases classified elsewhere: Secondary | ICD-10-CM | POA: Diagnosis not present

## 2022-07-20 DIAGNOSIS — J019 Acute sinusitis, unspecified: Secondary | ICD-10-CM

## 2022-07-20 HISTORY — DX: Other specified bacterial agents as the cause of diseases classified elsewhere: B96.89

## 2022-07-20 MED ORDER — PREDNISONE 10 MG PO TABS: 10.0000 mg | ORAL_TABLET | Freq: Every day | ORAL | 0 refills | Status: AC

## 2022-07-20 MED ORDER — AMOXICILLIN-POT CLAVULANATE 875-125 MG PO TABS: 1.0000 | ORAL_TABLET | Freq: Two times a day (BID) | ORAL | 0 refills | Status: DC

## 2022-07-20 MED ORDER — ALBUTEROL SULFATE HFA 108 (90 BASE) MCG/ACT IN AERS: 2.0000 | INHALATION_SPRAY | Freq: Four times a day (QID) | RESPIRATORY_TRACT | 1 refills | Status: AC | PRN

## 2022-07-20 MED ORDER — AZELASTINE-FLUTICASONE 137-50 MCG/ACT NA SUSP: 1.0000 | Freq: Two times a day (BID) | NASAL | 1 refills | Status: AC

## 2022-07-20 MED ORDER — GUAIFENESIN 100 MG/5ML PO LIQD: 5.0000 mL | ORAL | 0 refills | Status: AC | PRN

## 2022-07-20 NOTE — Patient Instructions (Signed)
Please take medications as ordered. If symptoms get worse please contact your health care provider. 

## 2022-07-20 NOTE — Assessment & Plan Note (Addendum)
Patient felt facial pain on light palpation of the face, face pain and pressure worsen when leaning forward. Otoscope showed  bilateral erythema tympanic membrane on both ear. Bilateral yellow nasal discharge noted

## 2022-07-20 NOTE — Progress Notes (Signed)
Acute Office Visit  Subjective:     Patient ID: Nathan Bird, male    DOB: 15-Nov-2000, 21 y.o.   MRN: [id number]  Chief Complaint  Patient presents with   Sinus Problem    Patient complains of sinus congestion, chest aches and tightness, SOB for over a week.    Nathan Bird 22 year old male,is in today for upper respiratory symptoms with facial pain before 07/12/22. Patient stated symptoms are getting worse. Has tried OTC medications with no relief.   Sinus Pain: Patient complains of achiness, congestion, facial pain, non productive cough, post nasal drip, shortness of breath, sinus pressure, sneezing, and sore throat. Onset of symptoms was a few days ago, gradually worsening since that time. He is drinking plenty of fluids. Past history is significant for no history of pneumonia or bronchitis. Patient is smoker    Sinus Problem This is a new problem. The current episode started 1 to 4 weeks ago. The problem has been rapidly worsening since onset. There has been no fever. His pain is at a severity of 5/10. The pain is moderate. Associated symptoms include chills, congestion, coughing, headaches, a hoarse voice, neck pain, shortness of breath, sinus pressure, sneezing and a sore throat. Pertinent negatives include no diaphoresis, ear pain or swollen glands. Past treatments include acetaminophen. The treatment provided no relief.    Review of Systems  Constitutional:  Positive for chills and malaise/fatigue. Negative for diaphoresis and fever.  HENT:  Positive for congestion, hoarse voice, sinus pressure, sneezing and sore throat. Negative for ear pain.   Respiratory:  Positive for cough and shortness of breath. Negative for sputum production.   Cardiovascular:  Positive for chest pain. Negative for palpitations, orthopnea and claudication.  Gastrointestinal:  Negative for abdominal pain, constipation, diarrhea, nausea and vomiting.  Musculoskeletal:  Positive for neck pain.  Skin:   Negative for itching and rash.  Neurological:  Positive for headaches.        Objective:    BP 115/70   Pulse 65   Ht 5\' 10"  (1.778 m)   Wt 261 lb (118.4 kg)   SpO2 98%   BMI 37.45 kg/m  BP Readings from Last 3 Encounters:  07/20/22 115/70  03/05/22 133/73  10/21/21 131/78      Physical Exam HENT:     Right Ear: Tympanic membrane is erythematous.     Left Ear: Tympanic membrane is erythematous.     Nose: Congestion and rhinorrhea present.     Mouth/Throat:     Pharynx: Oropharynx is clear. Uvula midline. Posterior oropharyngeal erythema present. No pharyngeal swelling or oropharyngeal exudate.     Tonsils: No tonsillar exudate or tonsillar abscesses.  Cardiovascular:     Rate and Rhythm: Normal rate and regular rhythm.     Pulses: Normal pulses.  Pulmonary:     Effort: Pulmonary effort is normal. No respiratory distress.     Breath sounds: Normal breath sounds.  Musculoskeletal:     Cervical back: Normal range of motion.  Skin:    General: Skin is warm and dry.     Capillary Refill: Capillary refill takes less than 2 seconds.  Neurological:     Mental Status: He is alert. Mental status is at baseline.  Psychiatric:        Mood and Affect: Mood normal.     No results found for any visits on 07/20/22.      Assessment & Plan:   Problem List Items Addressed This Visit  Respiratory   Acute bacterial rhinosinusitis - Primary (Chronic)    Patient felt facial pain on light palpation of the face, face pain and pressure worsen when leaning forward. Otoscope showed  bilateral erythema tympanic membrane on both ear. Bilateral yellow nasal discharge noted      Relevant Medications   predniSONE (DELTASONE) 10 MG tablet   albuterol (VENTOLIN HFA) 108 (90 Base) MCG/ACT inhaler   guaiFENesin (ROBITUSSIN) 100 MG/5ML liquid   Azelastine-Fluticasone 137-50 MCG/ACT SUSP   amoxicillin-clavulanate (AUGMENTIN) 875-125 MG tablet    Meds ordered this encounter   Medications   predniSONE (DELTASONE) 10 MG tablet    Sig: Take 1 tablet (10 mg total) by mouth daily with breakfast.    Dispense:  30 tablet    Refill:  0   albuterol (VENTOLIN HFA) 108 (90 Base) MCG/ACT inhaler    Sig: Inhale 2 puffs into the lungs every 6 (six) hours as needed for shortness of breath.    Dispense:  8 g    Refill:  1   guaiFENesin (ROBITUSSIN) 100 MG/5ML liquid    Sig: Take 5 mLs by mouth every 4 (four) hours as needed for cough or to loosen phlegm.    Dispense:  120 mL    Refill:  0   Azelastine-Fluticasone 137-50 MCG/ACT SUSP    Sig: Place 1 spray into the nose every 12 (twelve) hours.    Dispense:  23 g    Refill:  1   amoxicillin-clavulanate (AUGMENTIN) 875-125 MG tablet    Sig: Take 1 tablet by mouth 2 (two) times daily.    Dispense:  10 tablet    Refill:  0    No follow-ups on file.  Renard Hamper Ria Comment, FNP

## 2022-09-06 ENCOUNTER — Encounter: Payer: Self-pay | Admitting: Internal Medicine

## 2022-09-06 ENCOUNTER — Ambulatory Visit (INDEPENDENT_AMBULATORY_CARE_PROVIDER_SITE_OTHER): Payer: Medicaid Other | Admitting: Internal Medicine

## 2022-09-06 VITALS — BP 113/74 | HR 65 | Ht 70.0 in | Wt 269.8 lb

## 2022-09-06 DIAGNOSIS — F418 Other specified anxiety disorders: Secondary | ICD-10-CM

## 2022-09-06 DIAGNOSIS — E669 Obesity, unspecified: Secondary | ICD-10-CM

## 2022-09-06 NOTE — Progress Notes (Signed)
Established Patient Office Visit  Subjective   Patient ID: Nathan Bird, male    DOB: 11/12/2000  Age: 22 y.o. MRN: [id number]  Chief Complaint  Patient presents with   Anxiety    Follow up   Nathan Bird returns to care today for routine follow-up.  He was last seen by me on 03/05/22 as a new patient presenting to establish care.  No medication changes were made at that time and 37-monthfollow-up was arranged.  [facility] and was treated for sinusitis.  There have otherwise been no acute interval events.  Nathan Bird feeling well today.  He is asymptomatic and has no acute concerns to discuss.  Past Medical History:  Diagnosis Date   Acute bacterial rhinosinusitis 07/20/2022   Allergic rhinitis 12/08/2012   Kidney stones    Obesity, Class III, BMI 40-49.9 (morbid obesity) ([facility]    Obesity, unspecified 12/08/2012   Past Surgical History:  Procedure Laterality Date   TONSILLECTOMY     Social History   Tobacco Use   Smoking status: Never    Passive exposure: Yes   Smokeless tobacco: Never  Vaping Use   Vaping Use: Every day  Substance Use Topics   Alcohol use: Yes    Comment: occ   Drug use: Yes    Types: Marijuana   Family History  Problem Relation Age of Onset   Diabetes Paternal Grandfather    Stroke Paternal Grandfather    Cancer Mother    Depression Mother    Depression Father    Hypertension Father    Diabetes Father        borderline   Diabetes Maternal Grandmother    Cancer Maternal Grandfather        renal   Kidney disease Maternal Grandfather    No Known Allergies  Review of Systems  Constitutional:  Negative for chills and fever.  HENT:  Negative for sore throat.   Respiratory:  Negative for cough and shortness of breath.   Cardiovascular:  Negative for chest pain, palpitations and leg swelling.  Gastrointestinal:  Negative for abdominal pain, blood in stool, constipation,  diarrhea, nausea and vomiting.  Genitourinary:  Negative for dysuria and hematuria.  Musculoskeletal:  Negative for myalgias.  Skin:  Negative for itching and rash.  Neurological:  Negative for dizziness and headaches.  Psychiatric/Behavioral:  Negative for depression and suicidal ideas.      Objective:     BP 113/74   Pulse 65   Ht 5' 10"$  (1.778 m)   Wt 269 lb 12.8 oz (122.4 kg)   SpO2 98%   BMI 38.71 kg/m  BP Readings from Last 3 Encounters:  09/06/22 113/74  07/20/22 115/70  03/05/22 133/73   Physical Exam Vitals reviewed.  Constitutional:      General: He is not in acute distress.    Appearance: Normal appearance. He is obese. He is not ill-appearing.  HENT:     Head: Normocephalic and atraumatic.     Right Ear: External ear normal.     Left Ear: External ear normal.     Nose: Nose normal. No congestion or rhinorrhea.     Mouth/Throat:     Mouth: Mucous membranes are moist.     Pharynx: Oropharynx is clear.  Eyes:     General: No scleral icterus.    Extraocular Movements: Extraocular movements intact.     Conjunctiva/sclera: Conjunctivae normal.  Pupils: Pupils are equal, round, and reactive to light.  Cardiovascular:     Rate and Rhythm: Normal rate and regular rhythm.     Pulses: Normal pulses.     Heart sounds: Normal heart sounds. No murmur heard. Pulmonary:     Effort: Pulmonary effort is normal.     Breath sounds: Normal breath sounds. No wheezing, rhonchi or rales.  Abdominal:     General: Abdomen is flat. Bowel sounds are normal. There is no distension.     Palpations: Abdomen is soft.     Tenderness: There is no abdominal tenderness.  Musculoskeletal:        General: No swelling or deformity. Normal range of motion.     Cervical back: Normal range of motion.  Skin:    General: Skin is warm and dry.     Capillary Refill: Capillary refill takes less than 2 seconds.  Neurological:     General: No focal deficit present.     Mental Status: He is  alert and oriented to person, place, and time.     Motor: No weakness.  Psychiatric:        Mood and Affect: Mood normal.        Behavior: Behavior normal.        Thought Content: Thought content normal.      Assessment & Plan:   Problem List Items Addressed This Visit       Obesity (BMI 35.0-39.9 without comorbidity) - Primary    BMI 38.7 today.  His weight is unchanged from his last appointment in August.  -We again reviewed lifestyle modifications aimed at weight loss, which include healthy eating habits and limiting fatty/fried food, as well as aiming for at least 150 minutes of moderate intensity exercise on a weekly basis.  He has expressed understanding. -Plan to update labs at follow-up in 6 months      Depression with anxiety    PHQ-9 score remains mildly elevated today (11).  He was previously on Lexapro but has been off of medication for close to 1 year.  He denies SI/HI and overall feels that things are going well.  His mood is stable currently.  He is not interested in any additional treatment at this time.       Return in about 6 months (around 03/07/2023).    Johnette Abraham, MD

## 2022-09-06 NOTE — Patient Instructions (Signed)
It was a pleasure to see you today.  Thank you for giving [place] the opportunity to be involved in your care.  Below is a brief recap of your visit and next steps.  We will plan to see you again in 6 months.  Summary No medication changes today Follow up in 6 months and update labs at that time

## 2022-09-06 NOTE — Assessment & Plan Note (Signed)
BMI 38.7 today.  His weight is unchanged from his last appointment in August.  -We again reviewed lifestyle modifications aimed at weight loss, which include healthy eating habits and limiting fatty/fried food, as well as aiming for at least 150 minutes of moderate intensity exercise on a weekly basis.  He has expressed understanding. -Plan to update labs at follow-up in 6 months

## 2022-09-06 NOTE — Assessment & Plan Note (Signed)
PHQ-9 score remains mildly elevated today (11).  He was previously on Lexapro but has been off of medication for close to 1 year.  He denies SI/HI and overall feels that things are going well.  His mood is stable currently.  He is not interested in any additional treatment at this time.

## 2023-03-10 ENCOUNTER — Ambulatory Visit: Payer: Medicaid Other | Admitting: Internal Medicine

## 2023-03-11 ENCOUNTER — Encounter: Payer: Self-pay | Admitting: Internal Medicine

## 2023-03-31 ENCOUNTER — Encounter: Payer: Self-pay | Admitting: *Deleted

## 2024-04-06 ENCOUNTER — Encounter: Payer: Self-pay | Admitting: *Deleted
